# Patient Record
Sex: Male | Born: 1970 | Race: White | Hispanic: No | Marital: Married | Smoking: Never smoker
Health system: Southern US, Community
[De-identification: ages and names within clinical notes are randomized; demographics above are authoritative.]

## PROBLEM LIST (undated history)

## (undated) DIAGNOSIS — E876 Hypokalemia: Secondary | ICD-10-CM

## (undated) DIAGNOSIS — E785 Hyperlipidemia, unspecified: Secondary | ICD-10-CM

## (undated) DIAGNOSIS — E7849 Other hyperlipidemia: Secondary | ICD-10-CM

## (undated) DIAGNOSIS — R079 Chest pain, unspecified: Secondary | ICD-10-CM

## (undated) DIAGNOSIS — G4700 Insomnia, unspecified: Secondary | ICD-10-CM

## (undated) DIAGNOSIS — F1020 Alcohol dependence, uncomplicated: Secondary | ICD-10-CM

## (undated) DIAGNOSIS — K219 Gastro-esophageal reflux disease without esophagitis: Secondary | ICD-10-CM

## (undated) DIAGNOSIS — F10239 Alcohol dependence with withdrawal, unspecified: Secondary | ICD-10-CM

## (undated) DIAGNOSIS — I4891 Unspecified atrial fibrillation: Secondary | ICD-10-CM

## (undated) DIAGNOSIS — M87051 Idiopathic aseptic necrosis of right femur: Secondary | ICD-10-CM

## (undated) DIAGNOSIS — R351 Nocturia: Secondary | ICD-10-CM

## (undated) DIAGNOSIS — R112 Nausea with vomiting, unspecified: Secondary | ICD-10-CM

## (undated) DIAGNOSIS — D649 Anemia, unspecified: Secondary | ICD-10-CM

## (undated) DIAGNOSIS — F419 Anxiety disorder, unspecified: Secondary | ICD-10-CM

## (undated) DIAGNOSIS — F1010 Alcohol abuse, uncomplicated: Secondary | ICD-10-CM

## (undated) DIAGNOSIS — I499 Cardiac arrhythmia, unspecified: Secondary | ICD-10-CM

## (undated) DIAGNOSIS — M2550 Pain in unspecified joint: Secondary | ICD-10-CM

## (undated) DIAGNOSIS — E119 Type 2 diabetes mellitus without complications: Secondary | ICD-10-CM

## (undated) DIAGNOSIS — F329 Major depressive disorder, single episode, unspecified: Secondary | ICD-10-CM

## (undated) DIAGNOSIS — R7989 Other specified abnormal findings of blood chemistry: Secondary | ICD-10-CM

## (undated) DIAGNOSIS — M1990 Unspecified osteoarthritis, unspecified site: Secondary | ICD-10-CM

## (undated) DIAGNOSIS — F32A Depression, unspecified: Secondary | ICD-10-CM

## (undated) DIAGNOSIS — M2540 Effusion, unspecified joint: Secondary | ICD-10-CM

## (undated) DIAGNOSIS — M6208 Separation of muscle (nontraumatic), other site: Secondary | ICD-10-CM

## (undated) DIAGNOSIS — M87052 Idiopathic aseptic necrosis of left femur: Secondary | ICD-10-CM

## (undated) DIAGNOSIS — M47816 Spondylosis without myelopathy or radiculopathy, lumbar region: Secondary | ICD-10-CM

## (undated) DIAGNOSIS — R739 Hyperglycemia, unspecified: Secondary | ICD-10-CM

## (undated) DIAGNOSIS — R945 Abnormal results of liver function studies: Secondary | ICD-10-CM

## (undated) DIAGNOSIS — M109 Gout, unspecified: Secondary | ICD-10-CM

## (undated) DIAGNOSIS — I10 Essential (primary) hypertension: Secondary | ICD-10-CM

## (undated) DIAGNOSIS — G9340 Encephalopathy, unspecified: Secondary | ICD-10-CM

## (undated) HISTORY — DX: Hypokalemia: E87.6

## (undated) HISTORY — DX: Gastro-esophageal reflux disease without esophagitis: K21.9

## (undated) HISTORY — DX: Hyperglycemia, unspecified: R73.9

## (undated) HISTORY — PX: COLONOSCOPY: SHX174

## (undated) HISTORY — DX: Other hyperlipidemia: E78.49

## (undated) HISTORY — DX: Alcohol dependence with withdrawal, unspecified: F10.239

## (undated) HISTORY — DX: Chest pain, unspecified: R07.9

## (undated) HISTORY — DX: Nausea with vomiting, unspecified: R11.2

## (undated) HISTORY — DX: Idiopathic aseptic necrosis of left femur: M87.052

## (undated) HISTORY — DX: Encephalopathy, unspecified: G93.40

## (undated) HISTORY — DX: Spondylosis without myelopathy or radiculopathy, lumbar region: M47.816

## (undated) HISTORY — DX: Unspecified atrial fibrillation: I48.91

## (undated) HISTORY — DX: Anemia, unspecified: D64.9

## (undated) HISTORY — DX: Insomnia, unspecified: G47.00

## (undated) HISTORY — DX: Abnormal results of liver function studies: R94.5

## (undated) HISTORY — DX: Idiopathic aseptic necrosis of right femur: M87.051

## (undated) HISTORY — DX: Separation of muscle (nontraumatic), other site: M62.08

## (undated) HISTORY — DX: Other specified abnormal findings of blood chemistry: R79.89

## (undated) HISTORY — DX: Major depressive disorder, single episode, unspecified: F32.9

## (undated) HISTORY — DX: Alcohol dependence, uncomplicated: F10.20

## (undated) HISTORY — DX: Type 2 diabetes mellitus without complications: E11.9

## (undated) HISTORY — PX: ESOPHAGOGASTRODUODENOSCOPY: SHX1529

## (undated) HISTORY — DX: Anxiety disorder, unspecified: F41.9

---

## 2004-12-15 ENCOUNTER — Ambulatory Visit: Payer: Self-pay | Admitting: Family Medicine

## 2005-04-14 ENCOUNTER — Ambulatory Visit: Payer: Self-pay | Admitting: Family Medicine

## 2008-02-20 ENCOUNTER — Encounter: Admission: RE | Admit: 2008-02-20 | Discharge: 2008-02-20 | Payer: Self-pay | Admitting: Family Medicine

## 2008-02-24 ENCOUNTER — Encounter: Admission: RE | Admit: 2008-02-24 | Discharge: 2008-02-24 | Payer: Self-pay | Admitting: Family Medicine

## 2012-08-18 ENCOUNTER — Encounter (HOSPITAL_COMMUNITY): Payer: Self-pay | Admitting: Emergency Medicine

## 2012-08-18 ENCOUNTER — Inpatient Hospital Stay (HOSPITAL_COMMUNITY)
Admission: EM | Admit: 2012-08-18 | Discharge: 2012-08-25 | DRG: 750 | Disposition: A | Discharge status: Home / Self Care | Payer: BC Managed Care – PPO | Attending: Internal Medicine | Admitting: Internal Medicine

## 2012-08-18 DIAGNOSIS — E669 Obesity, unspecified: Secondary | ICD-10-CM | POA: Diagnosis present

## 2012-08-18 DIAGNOSIS — R7309 Other abnormal glucose: Secondary | ICD-10-CM

## 2012-08-18 DIAGNOSIS — I248 Other forms of acute ischemic heart disease: Secondary | ICD-10-CM | POA: Diagnosis present

## 2012-08-18 DIAGNOSIS — E871 Hypo-osmolality and hyponatremia: Secondary | ICD-10-CM | POA: Diagnosis present

## 2012-08-18 DIAGNOSIS — D61818 Other pancytopenia: Secondary | ICD-10-CM | POA: Diagnosis present

## 2012-08-18 DIAGNOSIS — M109 Gout, unspecified: Secondary | ICD-10-CM | POA: Diagnosis present

## 2012-08-18 DIAGNOSIS — F10239 Alcohol dependence with withdrawal, unspecified: Secondary | ICD-10-CM | POA: Diagnosis present

## 2012-08-18 DIAGNOSIS — R0609 Other forms of dyspnea: Secondary | ICD-10-CM | POA: Diagnosis present

## 2012-08-18 DIAGNOSIS — E876 Hypokalemia: Secondary | ICD-10-CM

## 2012-08-18 DIAGNOSIS — E785 Hyperlipidemia, unspecified: Secondary | ICD-10-CM | POA: Diagnosis present

## 2012-08-18 DIAGNOSIS — K219 Gastro-esophageal reflux disease without esophagitis: Secondary | ICD-10-CM | POA: Diagnosis present

## 2012-08-18 DIAGNOSIS — E869 Volume depletion, unspecified: Secondary | ICD-10-CM | POA: Diagnosis present

## 2012-08-18 DIAGNOSIS — F10231 Alcohol dependence with withdrawal delirium: Principal | ICD-10-CM | POA: Diagnosis present

## 2012-08-18 DIAGNOSIS — F1010 Alcohol abuse, uncomplicated: Secondary | ICD-10-CM | POA: Diagnosis present

## 2012-08-18 DIAGNOSIS — I10 Essential (primary) hypertension: Secondary | ICD-10-CM | POA: Diagnosis present

## 2012-08-18 DIAGNOSIS — R748 Abnormal levels of other serum enzymes: Secondary | ICD-10-CM | POA: Diagnosis present

## 2012-08-18 DIAGNOSIS — R112 Nausea with vomiting, unspecified: Secondary | ICD-10-CM

## 2012-08-18 DIAGNOSIS — F10931 Alcohol use, unspecified with withdrawal delirium: Principal | ICD-10-CM | POA: Diagnosis present

## 2012-08-18 DIAGNOSIS — R0989 Other specified symptoms and signs involving the circulatory and respiratory systems: Secondary | ICD-10-CM | POA: Diagnosis present

## 2012-08-18 DIAGNOSIS — I4891 Unspecified atrial fibrillation: Secondary | ICD-10-CM | POA: Diagnosis present

## 2012-08-18 DIAGNOSIS — Z79899 Other long term (current) drug therapy: Secondary | ICD-10-CM

## 2012-08-18 DIAGNOSIS — G9340 Encephalopathy, unspecified: Secondary | ICD-10-CM | POA: Diagnosis present

## 2012-08-18 DIAGNOSIS — R7989 Other specified abnormal findings of blood chemistry: Secondary | ICD-10-CM | POA: Diagnosis present

## 2012-08-18 DIAGNOSIS — K5289 Other specified noninfective gastroenteritis and colitis: Secondary | ICD-10-CM | POA: Diagnosis present

## 2012-08-18 DIAGNOSIS — F1020 Alcohol dependence, uncomplicated: Secondary | ICD-10-CM | POA: Diagnosis present

## 2012-08-18 DIAGNOSIS — F10939 Alcohol use, unspecified with withdrawal, unspecified: Secondary | ICD-10-CM

## 2012-08-18 DIAGNOSIS — Z8249 Family history of ischemic heart disease and other diseases of the circulatory system: Secondary | ICD-10-CM

## 2012-08-18 DIAGNOSIS — I2489 Other forms of acute ischemic heart disease: Secondary | ICD-10-CM | POA: Diagnosis present

## 2012-08-18 DIAGNOSIS — Z6831 Body mass index (BMI) 31.0-31.9, adult: Secondary | ICD-10-CM

## 2012-08-18 DIAGNOSIS — R739 Hyperglycemia, unspecified: Secondary | ICD-10-CM

## 2012-08-18 DIAGNOSIS — Z23 Encounter for immunization: Secondary | ICD-10-CM

## 2012-08-18 HISTORY — DX: Alcohol dependence with withdrawal, unspecified: F10.239

## 2012-08-18 HISTORY — DX: Hypokalemia: E87.6

## 2012-08-18 HISTORY — DX: Alcohol abuse, uncomplicated: F10.10

## 2012-08-18 HISTORY — DX: Gastro-esophageal reflux disease without esophagitis: K21.9

## 2012-08-18 HISTORY — DX: Nausea with vomiting, unspecified: R11.2

## 2012-08-18 HISTORY — DX: Hyperlipidemia, unspecified: E78.5

## 2012-08-18 HISTORY — DX: Hyperglycemia, unspecified: R73.9

## 2012-08-18 HISTORY — DX: Unspecified atrial fibrillation: I48.91

## 2012-08-18 HISTORY — DX: Alcohol use, unspecified with withdrawal, unspecified: F10.939

## 2012-08-18 HISTORY — DX: Essential (primary) hypertension: I10

## 2012-08-18 HISTORY — DX: Gout, unspecified: M10.9

## 2012-08-18 LAB — CBC WITH DIFFERENTIAL/PLATELET
Basophils Relative: 0 % (ref 0–1)
Eosinophils Relative: 0 % (ref 0–5)
Hemoglobin: 13.9 g/dL (ref 13.0–17.0)
Lymphs Abs: 0.5 10*3/uL — ABNORMAL LOW (ref 0.7–4.0)
MCH: 32.3 pg (ref 26.0–34.0)
MCHC: 34.4 g/dL (ref 30.0–36.0)
MCV: 93.7 fL (ref 78.0–100.0)
Monocytes Absolute: 1.1 10*3/uL — ABNORMAL HIGH (ref 0.1–1.0)
Neutro Abs: 5.2 10*3/uL (ref 1.7–7.7)
RBC: 4.31 MIL/uL (ref 4.22–5.81)

## 2012-08-18 LAB — COMPREHENSIVE METABOLIC PANEL
AST: 64 U/L — ABNORMAL HIGH (ref 0–37)
BUN: 14 mg/dL (ref 6–23)
CO2: 23 mEq/L (ref 19–32)
Chloride: 85 mEq/L — ABNORMAL LOW (ref 96–112)
Creatinine, Ser: 0.97 mg/dL (ref 0.50–1.35)
GFR calc Af Amer: >90 mL/min (ref >90)
GFR calc non Af Amer: >90 mL/min (ref >90)
Glucose, Bld: 226 mg/dL — ABNORMAL HIGH (ref 70–99)
Total Bilirubin: 0.8 mg/dL (ref 0.3–1.2)

## 2012-08-18 LAB — RAPID URINE DRUG SCREEN, HOSP PERFORMED
Opiates: NOT DETECTED
Tetrahydrocannabinol: NOT DETECTED

## 2012-08-18 LAB — GLUCOSE, CAPILLARY: Glucose-Capillary: 154 mg/dL — ABNORMAL HIGH (ref 70–99)

## 2012-08-18 MED ORDER — POTASSIUM CHLORIDE IN NACL 20-0.9 MEQ/L-% IV SOLN
INTRAVENOUS | Status: DC
  Administered 2012-08-18 – 2012-08-19 (×4): via INTRAVENOUS
  Administered 2012-08-20 – 2012-08-21 (×2): 50 mL/h via INTRAVENOUS
  Administered 2012-08-22 – 2012-08-23 (×3): via INTRAVENOUS
  Filled 2012-08-18 (×12): qty 1000

## 2012-08-18 MED ORDER — PNEUMOCOCCAL VAC POLYVALENT 25 MCG/0.5ML IJ INJ
0.5000 mL | INJECTION | INTRAMUSCULAR | Status: AC
  Administered 2012-08-20: 0.5 mL via INTRAMUSCULAR
  Filled 2012-08-18 (×2): qty 0.5

## 2012-08-18 MED ORDER — VITAMIN B-1 100 MG PO TABS
100.0000 mg | ORAL_TABLET | Freq: Every day | ORAL | Status: DC
  Administered 2012-08-21: 100 mg via ORAL
  Filled 2012-08-18 (×4): qty 1

## 2012-08-18 MED ORDER — LORAZEPAM 2 MG/ML IJ SOLN
0.0000 mg | Freq: Four times a day (QID) | INTRAMUSCULAR | Status: DC
  Administered 2012-08-18: 2 mg via INTRAVENOUS
  Filled 2012-08-18 (×2): qty 1

## 2012-08-18 MED ORDER — METOPROLOL TARTRATE 1 MG/ML IV SOLN
5.0000 mg | Freq: Once | INTRAVENOUS | Status: AC
  Administered 2012-08-18: 5 mg via INTRAVENOUS
  Filled 2012-08-18: qty 5

## 2012-08-18 MED ORDER — POTASSIUM CHLORIDE 10 MEQ/100ML IV SOLN
10.0000 meq | INTRAVENOUS | Status: AC
  Administered 2012-08-18 – 2012-08-19 (×6): 10 meq via INTRAVENOUS
  Filled 2012-08-18: qty 500
  Filled 2012-08-18: qty 100

## 2012-08-18 MED ORDER — SODIUM CHLORIDE 0.9 % IV SOLN: INTRAVENOUS | Status: DC

## 2012-08-18 MED ORDER — ADULT MULTIVITAMIN W/MINERALS CH
1.0000 | ORAL_TABLET | Freq: Every day | ORAL | Status: DC
  Filled 2012-08-18: qty 1

## 2012-08-18 MED ORDER — DILTIAZEM HCL 50 MG/10ML IV SOLN: 10.0000 mg | Freq: Once | INTRAVENOUS | Status: DC

## 2012-08-18 MED ORDER — FOLIC ACID 1 MG PO TABS
1.0000 mg | ORAL_TABLET | Freq: Every day | ORAL | Status: DC
  Filled 2012-08-18: qty 1

## 2012-08-18 MED ORDER — HYDROMORPHONE HCL PF 1 MG/ML IJ SOLN
0.5000 mg | INTRAMUSCULAR | Status: DC | PRN
  Administered 2012-08-22: 1 mg via INTRAVENOUS
  Filled 2012-08-18: qty 1

## 2012-08-18 MED ORDER — LORAZEPAM 2 MG/ML IJ SOLN
1.0000 mg | Freq: Once | INTRAMUSCULAR | Status: AC
  Administered 2012-08-18: 1 mg via INTRAVENOUS
  Filled 2012-08-18: qty 1

## 2012-08-18 MED ORDER — LORAZEPAM 2 MG/ML IJ SOLN: 2.0000 mg | Freq: Once | INTRAMUSCULAR | Status: DC

## 2012-08-18 MED ORDER — ONDANSETRON HCL 4 MG/2ML IJ SOLN
4.0000 mg | Freq: Once | INTRAMUSCULAR | Status: AC
  Administered 2012-08-18: 4 mg via INTRAVENOUS
  Filled 2012-08-18: qty 2

## 2012-08-18 MED ORDER — SODIUM CHLORIDE 0.9 % IV BOLUS (SEPSIS)
500.0000 mL | Freq: Once | INTRAVENOUS | Status: AC
  Administered 2012-08-18: 500 mL via INTRAVENOUS

## 2012-08-18 MED ORDER — POTASSIUM CHLORIDE CRYS ER 20 MEQ PO TBCR: 40.0000 meq | EXTENDED_RELEASE_TABLET | Freq: Once | ORAL | Status: DC

## 2012-08-18 MED ORDER — LORAZEPAM 1 MG PO TABS: 1.0000 mg | ORAL_TABLET | Freq: Four times a day (QID) | ORAL | Status: DC | PRN

## 2012-08-18 MED ORDER — ENOXAPARIN SODIUM 40 MG/0.4ML ~~LOC~~ SOLN
40.0000 mg | Freq: Every day | SUBCUTANEOUS | Status: DC
  Administered 2012-08-18 – 2012-08-24 (×7): 40 mg via SUBCUTANEOUS
  Filled 2012-08-18 (×8): qty 0.4

## 2012-08-18 MED ORDER — LORAZEPAM 2 MG/ML IJ SOLN
4.0000 mg | Freq: Once | INTRAMUSCULAR | Status: AC
  Administered 2012-08-19: 4 mg via INTRAVENOUS

## 2012-08-18 MED ORDER — ACETAMINOPHEN 325 MG PO TABS
650.0000 mg | ORAL_TABLET | Freq: Four times a day (QID) | ORAL | Status: DC | PRN
  Administered 2012-08-22: 650 mg via ORAL
  Filled 2012-08-18: qty 2

## 2012-08-18 MED ORDER — SODIUM CHLORIDE 0.9 % IV BOLUS (SEPSIS)
1000.0000 mL | Freq: Once | INTRAVENOUS | Status: AC
Start: 2012-08-18 — End: 2012-08-18
  Administered 2012-08-18: 1000 mL via INTRAVENOUS

## 2012-08-18 MED ORDER — ACETAMINOPHEN 650 MG RE SUPP: 650.0000 mg | Freq: Four times a day (QID) | RECTAL | Status: DC | PRN

## 2012-08-18 MED ORDER — ALUM & MAG HYDROXIDE-SIMETH 200-200-20 MG/5ML PO SUSP: 30.0000 mL | Freq: Four times a day (QID) | ORAL | Status: DC | PRN

## 2012-08-18 MED ORDER — LORAZEPAM 2 MG/ML IJ SOLN
1.0000 mg | Freq: Four times a day (QID) | INTRAMUSCULAR | Status: DC | PRN
  Filled 2012-08-18: qty 1

## 2012-08-18 MED ORDER — THIAMINE HCL 100 MG/ML IJ SOLN
100.0000 mg | Freq: Every day | INTRAMUSCULAR | Status: DC
  Filled 2012-08-18: qty 1

## 2012-08-18 MED ORDER — DILTIAZEM HCL 100 MG IV SOLR: 5.0000 mg/h | INTRAVENOUS | Status: DC

## 2012-08-18 MED ORDER — THIAMINE HCL 100 MG/ML IJ SOLN
100.0000 mg | Freq: Every day | INTRAMUSCULAR | Status: DC
  Administered 2012-08-19 – 2012-08-20 (×2): 100 mg via INTRAVENOUS
  Filled 2012-08-18 (×4): qty 1

## 2012-08-18 MED ORDER — LORAZEPAM 1 MG PO TABS: 1.0000 mg | ORAL_TABLET | ORAL | Status: DC | PRN

## 2012-08-18 MED ORDER — ONDANSETRON HCL 4 MG PO TABS: 4.0000 mg | ORAL_TABLET | Freq: Four times a day (QID) | ORAL | Status: DC | PRN

## 2012-08-18 MED ORDER — THIAMINE HCL 100 MG/ML IJ SOLN
Freq: Once | INTRAVENOUS | Status: AC
  Administered 2012-08-19: via INTRAVENOUS
  Filled 2012-08-18: qty 1000

## 2012-08-18 MED ORDER — LORAZEPAM 2 MG/ML IJ SOLN: 0.0000 mg | Freq: Two times a day (BID) | INTRAMUSCULAR | Status: DC

## 2012-08-18 MED ORDER — INSULIN ASPART 100 UNIT/ML ~~LOC~~ SOLN
0.0000 [IU] | SUBCUTANEOUS | Status: DC
Start: 2012-08-18 — End: 2012-08-20
  Administered 2012-08-18: 2 [IU] via SUBCUTANEOUS
  Administered 2012-08-19 (×3): 1 [IU] via SUBCUTANEOUS

## 2012-08-18 MED ORDER — LORAZEPAM 2 MG/ML IJ SOLN
2.0000 mg | Freq: Once | INTRAMUSCULAR | Status: AC
  Administered 2012-08-18: 2 mg via INTRAVENOUS

## 2012-08-18 MED ORDER — VITAMIN B-1 100 MG PO TABS
100.0000 mg | ORAL_TABLET | Freq: Every day | ORAL | Status: DC
  Filled 2012-08-18: qty 1

## 2012-08-18 MED ORDER — DILTIAZEM HCL 100 MG IV SOLR
5.0000 mg/h | INTRAVENOUS | Status: DC
  Administered 2012-08-18: 5 mg/h via INTRAVENOUS
  Administered 2012-08-18: 15 mg/h via INTRAVENOUS
  Administered 2012-08-19 – 2012-08-20 (×2): 5 mg/h via INTRAVENOUS

## 2012-08-18 MED ORDER — LORAZEPAM 2 MG/ML IJ SOLN
1.0000 mg | INTRAMUSCULAR | Status: DC | PRN
  Filled 2012-08-18: qty 2
  Filled 2012-08-18: qty 1

## 2012-08-18 MED ORDER — POTASSIUM CHLORIDE 10 MEQ/100ML IV SOLN: 10.0000 meq | INTRAVENOUS | Status: DC

## 2012-08-18 MED ORDER — SODIUM CHLORIDE 0.9 % IV BOLUS (SEPSIS)
1000.0000 mL | Freq: Once | INTRAVENOUS | Status: AC
  Administered 2012-08-18: 1000 mL via INTRAVENOUS

## 2012-08-18 MED ORDER — MAGNESIUM SULFATE 40 MG/ML IJ SOLN
4.0000 g | Freq: Once | INTRAMUSCULAR | Status: AC
  Administered 2012-08-18: 4 g via INTRAVENOUS
  Filled 2012-08-18: qty 100

## 2012-08-18 MED ORDER — ONDANSETRON HCL 4 MG/2ML IJ SOLN: 4.0000 mg | Freq: Four times a day (QID) | INTRAMUSCULAR | Status: DC | PRN

## 2012-08-18 MED ORDER — DILTIAZEM LOAD VIA INFUSION
10.0000 mg | Freq: Once | INTRAVENOUS | Status: AC
  Administered 2012-08-18: 10 mg via INTRAVENOUS
  Filled 2012-08-18: qty 10

## 2012-08-18 NOTE — H&P (Signed)
[facility] History and Physical  LAMONTA CYPRESS ZOX:[id number] DOB: Dec 16, 1970 DOA: 08/18/2012  Referring physician:  EDP PCP:  Dr. Thomasena Edis Specialists:   Chief Complaints:   Hallucinating, wants Detox from Alcohol, and Palpitations  HPI: Steven Cooke is a 42 y.o. male with a history of Alcohol abuse and heavy drinking over the past 3 months or more who presents with complaints of hallucinations and agitation X 4-5 days, after he had nausea and vomiting 5 days ago.  He was unable to hold down any food, liquids or alcohol.  Per family he got better 2 days ago after he drank 1 small airplane bottle of liquor.   His last drink was 1 day ago, and his family reports he normally drinks all day long.   He also has had complaints of palpitations and his heart rate was found to be in the 140s in the ED, and he was in Atrial fibrillation.   He was administered an IV cardizem bolus and placed on an IV Cardiaem drip and referred for admission.   He was also medicated with IV Ativan.      Review of Systems: Unable to Obtain from Patient.      Past Medical History  Diagnosis Date  . Alcohol abuse   . Hypertension   . Reflux   . Gout   . Hyperlipidemia   . GERD (gastroesophageal reflux disease)      Past Surgical History  Procedure Laterality Date  . No past surgeries       Medications:  HOME MEDS: Prior to Admission medications   Medication Sig Start Date End Date Taking? Authorizing Provider  bisoprolol (ZEBETA) 5 MG tablet Take 5 mg by mouth daily.   Yes Historical Provider, MD  citalopram (CELEXA) 20 MG tablet Take 20 mg by mouth daily.   Yes Historical Provider, MD  dexlansoprazole (DEXILANT) 60 MG capsule Take 60 mg by mouth daily.   Yes Historical Provider, MD  fenofibrate 160 MG tablet Take 160 mg by mouth daily.   Yes Historical Provider, MD  LORazepam (ATIVAN) 1 MG tablet Take 1 mg by mouth once. Given at [facility]   Yes Historical Provider, MD     Allergies:  No Known Allergies  Social History:   reports that he has never smoked. He has never used smokeless tobacco. He reports that  drinks alcohol. He reports that he does not use illicit drugs.  Family History: Family History  Problem Relation Age of Onset  . CAD Father        Physical Exam:  GEN:  Obtunded 41 year old Obese Caucasian Male examined  and in no acute distress;  Filed Vitals:   08/18/12 1930 08/18/12 1938 08/18/12 1945 08/18/12 2000  BP: 143/95 143/95 129/86 141/118  Pulse:  156 137 132  Temp:      TempSrc:      Resp: 18  20 20   SpO2:   96% 92%   Blood pressure 141/118, pulse 132, temperature 97.8 F (36.6 C), temperature source Oral, resp. rate 20, SpO2 92.00%. PSYCH: He is alert and oriented x1;  HEENT: Normocephalic and Atraumatic, + Facial Erythema, Bulbous Nose, Mucous membranes pink; PERRLA; EOM intact; Fundi:  Benign;  No scleral icterus, Nares: Patent, Oropharynx: Clear,  Fair Dentition, Neck:  FROM, no cervical lymphadenopathy nor thyromegaly or carotid bruit; no JVD; Breasts:: Not examined CHEST WALL: No tenderness CHEST: Normal respiration, clear to auscultation bilaterally HEART: Regular rate and rhythm; no murmurs rubs or  gallops BACK: No kyphosis or scoliosis; no CVA tenderness ABDOMEN: Positive Bowel Sounds, Obese, soft non-tender; no masses, no organomegaly. Rectal Exam: Not done EXTREMITIES: No cyanosis, clubbing or edema; no ulcerations. Genitalia: not examined PULSES: 2+ and symmetric SKIN: Normal hydration no rash or ulceration CNS: Cranial nerves 2-12 grossly intact no focal neurologic deficit     Labs & Imaging Results for orders placed during the hospital encounter of 08/18/12 (from the past 48 hour(s))  CBC WITH DIFFERENTIAL     Status: Abnormal   Collection Time    08/18/12  4:34 PM      Result Value Range   WBC 6.8  4.0 - 10.5 K/uL   RBC 4.31  4.22 - 5.81 MIL/uL   Hemoglobin 13.9  13.0 - 17.0 g/dL   HCT 16.1   09.6 - 04.5 %   MCV 93.7  78.0 - 100.0 fL   MCH 32.3  26.0 - 34.0 pg   MCHC 34.4  30.0 - 36.0 g/dL   RDW 40.9  81.1 - 91.4 %   Platelets 111 (*) 150 - 400 K/uL   Neutrophils Relative 76  43 - 77 %   Lymphocytes Relative 8 (*) 12 - 46 %   Monocytes Relative 16 (*) 3 - 12 %   Eosinophils Relative 0  0 - 5 %   Basophils Relative 0  0 - 1 %   Neutro Abs 5.2  1.7 - 7.7 K/uL   Lymphs Abs 0.5 (*) 0.7 - 4.0 K/uL   Monocytes Absolute 1.1 (*) 0.1 - 1.0 K/uL   Eosinophils Absolute 0.0  0.0 - 0.7 K/uL   Basophils Absolute 0.0  0.0 - 0.1 K/uL   WBC Morphology ATYPICAL LYMPHOCYTES     Smear Review PLATELET COUNT CONFIRMED BY SMEAR    COMPREHENSIVE METABOLIC PANEL     Status: Abnormal   Collection Time    08/18/12  4:34 PM      Result Value Range   Sodium 127 (*) 135 - 145 mEq/L   Potassium 2.7 (*) 3.5 - 5.1 mEq/L   Comment: CRITICAL RESULT CALLED TO, READ BACK BY AND VERIFIED WITH:     WATERS,Q. AT 1807 ON [id number] BY LOVE,T.   Chloride 85 (*) 96 - 112 mEq/L   CO2 23  19 - 32 mEq/L   Glucose, Bld 226 (*) 70 - 99 mg/dL   BUN 14  6 - 23 mg/dL   Creatinine, Ser 2.13  0.50 - 1.35 mg/dL   Calcium 08.6  8.4 - 57.8 mg/dL   Total Protein 7.7  6.0 - 8.3 g/dL   Albumin 3.6  3.5 - 5.2 g/dL   AST 64 (*) 0 - 37 U/L   ALT 84 (*) 0 - 53 U/L   Alkaline Phosphatase 28 (*) 39 - 117 U/L   Total Bilirubin 0.8  0.3 - 1.2 mg/dL   GFR calc non Af Amer >90  >90 mL/min   GFR calc Af Amer >90  >90 mL/min   Comment:            The eGFR has been calculated     using the CKD EPI equation.     This calculation has not been     validated in all clinical     situations.     eGFR's persistently     <90 mL/min signify     possible Chronic Kidney Disease.  ETHANOL     Status: None   Collection Time    08/18/12  4:34 PM      Result Value Range   Alcohol, Ethyl (B) <11  0 - 11 mg/dL   Comment:            LOWEST DETECTABLE LIMIT FOR     SERUM ALCOHOL IS 11 mg/dL     FOR MEDICAL PURPOSES ONLY  URINE RAPID DRUG  SCREEN (HOSP PERFORMED)     Status: None   Collection Time    08/18/12  6:06 PM      Result Value Range   Opiates NONE DETECTED  NONE DETECTED   Cocaine NONE DETECTED  NONE DETECTED   Benzodiazepines NONE DETECTED  NONE DETECTED   Amphetamines NONE DETECTED  NONE DETECTED   Tetrahydrocannabinol NONE DETECTED  NONE DETECTED   Barbiturates NONE DETECTED  NONE DETECTED   Comment:            DRUG SCREEN FOR MEDICAL PURPOSES     ONLY.  IF CONFIRMATION IS NEEDED     FOR ANY PURPOSE, NOTIFY LAB     WITHIN 5 DAYS.                LOWEST DETECTABLE LIMITS     FOR URINE DRUG SCREEN     Drug Class       Cutoff (ng/mL)     Amphetamine      1000     Barbiturate      200     Benzodiazepine   200     Tricyclics       300     Opiates          300     Cocaine          300     THC              50      EKG: ordered   Assessment/Plan Principal Problem:   Alcohol withdrawal Active Problems:   Atrial fibrillation with RVR   Hyperglycemia   Hypokalemia   Nausea and vomiting   Alcohol abuse   1.   Alcohol Withdawral and Delirium Tremens-    CIWA Protocol with IV Ativan.  Refer for Outpat Detox and Counselling Options when alert and stable.  2.   Atrial fibrillation with RVR-   IV Cardizem drip titrate to keep HR < 105.    May need AMiodarone  3.   Hyperglycemia-   SSI ordered, check glucose levels q 4 hours.   New Diabetes Mellitis, previously borderline.     4.   Hypokalemia-   Due to Nausea and Vomiting.  Replete K+, check magnesium.     5.   Nausea and Vomiting-   Antiemetics PRN.   6.   Alcohol Abuse-  See #1.     7.   DVT prophylaxis with Lovenox, Monitor Platelets.        Code Status:  FULL CODE   Family Communication:  Wife and Sister at Bedside Disposition Plan:  TBA,  Consider Outpatient vs Inpatient Alcohol Detox Treatment/Counselling  Time spent: 77 Minutes  Ron Parker [facility] Pager [phone]  If 7PM-7AM, please contact  night-coverage [email or web address] Password [facility] 08/18/2012, 8:37 PM

## 2012-08-18 NOTE — ED Notes (Signed)
tues has been withdraws from etoh liquior pints at day and has been shaking dranks ome airplane bottles today, some emesis the past few days shaking, not feeling well, went over to [facility] for detox and inpatient treatment and was given 1 mg ativan at [employer]

## 2012-08-18 NOTE — Progress Notes (Signed)
Subjective: Interval History: admitted earlier from ED with hallucinations, probable delirium tremens and atrial fibrillation. Patient somnolent, not arousable to verbal stimuli, becomes agitated with tactile stimuli, interventions. HR 130-150, atrial fibrillation.  Objective: Vital signs in last 24 hours: Temp:  [97.8 F (36.6 C)] 97.8 F (36.6 C) (03/21 1604) Pulse Rate:  [72-156] 132 (03/21 2000) Resp:  [18-28] 20 (03/21 2000) BP: (126-154)/(80-120) 141/118 mmHg (03/21 2000) SpO2:  [92 %-97 %] 92 % (03/21 2000)  Intake/Output from previous day:   Intake/Output this shift:    BP 141/118  Pulse 132  Temp(Src) 97.8 F (36.6 C) (Oral)  Resp 20  SpO2 92% General appearance: combative and becomes agitated with stimuli, otherwise minimally responsive. Lungs: clear to auscultation bilaterally Heart: irregular rhythm, tachyardic rate - atrial fibrillation on telemetry  Results for orders placed during the hospital encounter of 08/18/12 (from the past 24 hour(s))  CBC WITH DIFFERENTIAL     Status: Abnormal   Collection Time    08/18/12  4:34 PM      Result Value Range   WBC 6.8  4.0 - 10.5 K/uL   RBC 4.31  4.22 - 5.81 MIL/uL   Hemoglobin 13.9  13.0 - 17.0 g/dL   HCT 96.0  45.4 - 09.8 %   MCV 93.7  78.0 - 100.0 fL   MCH 32.3  26.0 - 34.0 pg   MCHC 34.4  30.0 - 36.0 g/dL   RDW 11.9  14.7 - 82.9 %   Platelets 111 (*) 150 - 400 K/uL   Neutrophils Relative 76  43 - 77 %   Lymphocytes Relative 8 (*) 12 - 46 %   Monocytes Relative 16 (*) 3 - 12 %   Eosinophils Relative 0  0 - 5 %   Basophils Relative 0  0 - 1 %   Neutro Abs 5.2  1.7 - 7.7 K/uL   Lymphs Abs 0.5 (*) 0.7 - 4.0 K/uL   Monocytes Absolute 1.1 (*) 0.1 - 1.0 K/uL   Eosinophils Absolute 0.0  0.0 - 0.7 K/uL   Basophils Absolute 0.0  0.0 - 0.1 K/uL   WBC Morphology ATYPICAL LYMPHOCYTES     Smear Review PLATELET COUNT CONFIRMED BY SMEAR    COMPREHENSIVE METABOLIC PANEL     Status: Abnormal   Collection Time    08/18/12   4:34 PM      Result Value Range   Sodium 127 (*) 135 - 145 mEq/L   Potassium 2.7 (*) 3.5 - 5.1 mEq/L   Chloride 85 (*) 96 - 112 mEq/L   CO2 23  19 - 32 mEq/L   Glucose, Bld 226 (*) 70 - 99 mg/dL   BUN 14  6 - 23 mg/dL   Creatinine, Ser 5.62  0.50 - 1.35 mg/dL   Calcium 13.0  8.4 - 86.5 mg/dL   Total Protein 7.7  6.0 - 8.3 g/dL   Albumin 3.6  3.5 - 5.2 g/dL   AST 64 (*) 0 - 37 U/L   ALT 84 (*) 0 - 53 U/L   Alkaline Phosphatase 28 (*) 39 - 117 U/L   Total Bilirubin 0.8  0.3 - 1.2 mg/dL   GFR calc non Af Amer >90  >90 mL/min   GFR calc Af Amer >90  >90 mL/min  ETHANOL     Status: None   Collection Time    08/18/12  4:34 PM      Result Value Range   Alcohol, Ethyl (B) <11  0 -  11 mg/dL  URINE RAPID DRUG SCREEN (HOSP PERFORMED)     Status: None   Collection Time    08/18/12  6:06 PM      Result Value Range   Opiates NONE DETECTED  NONE DETECTED   Cocaine NONE DETECTED  NONE DETECTED   Benzodiazepines NONE DETECTED  NONE DETECTED   Amphetamines NONE DETECTED  NONE DETECTED   Tetrahydrocannabinol NONE DETECTED  NONE DETECTED   Barbiturates NONE DETECTED  NONE DETECTED  TROPONIN I     Status: Abnormal   Collection Time    08/18/12  7:58 PM      Result Value Range   Troponin I 1.69 (*) <0.30 ng/mL  MAGNESIUM     Status: Abnormal   Collection Time    08/18/12  7:58 PM      Result Value Range   Magnesium 0.9 (*) 1.5 - 2.5 mg/dL  GLUCOSE, CAPILLARY     Status: Abnormal   Collection Time    08/18/12  9:55 PM      Result Value Range   Glucose-Capillary 154 (*) 70 - 99 mg/dL   Comment 1 Documented in Chart     Comment 2 Notify RN      Studies/Results: No results found.  Scheduled Meds: . enoxaparin (LOVENOX) injection  40 mg Subcutaneous Q2000  . insulin aspart  0-9 Units Subcutaneous Q4H  . LORazepam  0-4 mg Intravenous Q6H   Followed by  . [START ON 08/20/2012] LORazepam  0-4 mg Intravenous Q12H  . magnesium sulfate 1 - 4 g bolus IVPB  4 g Intravenous Once  .  metoprolol  5 mg Intravenous Once  . [START ON 08/19/2012] pneumococcal 23 valent vaccine  0.5 mL Intramuscular Tomorrow-1000  . potassium chloride  10 mEq Intravenous Q1 Hr x 6  . banana bag IV 1000 mL ** MVI currently unavailable **   Intravenous Once  . sodium chloride  500 mL Intravenous Once  . thiamine  100 mg Oral Daily   Or  . thiamine  100 mg Intravenous Daily   Continuous Infusions: . 0.9 % NaCl with KCl 20 mEq / L 125 mL/hr at 08/18/12 2156  . diltiazem (CARDIZEM) infusion 15 mg/hr (08/18/12 1940)   PRN Meds:acetaminophen, acetaminophen, HYDROmorphone (DILAUDID) injection, LORazepam, LORazepam, ondansetron (ZOFRAN) IV, ondansetron  Assessment/Plan:  EKG Changes: EKG at 21:42 pm with ST changes Discussed with Elink and called Cardiology (Dr. Clifton James) as recommended. Per Dr. Clifton James changes likely due to demand ischemia and electrolyte imbalance (potassium 2.7 and magnesium 0.9). Should improve with hydration and electrolyte replacement as well as rate control.  Atrial fibrillation: On Cardizem drip at 20 mg/hr. HR 130s with BP 140/70 - OTO Lopressor 5 mg IV.    LOS: 0 days   Ericka Marcellus A.

## 2012-08-18 NOTE — ED Provider Notes (Signed)
History     CSN: [id number]  Arrival date & time 08/18/12  1541   First MD Initiated Contact with Patient 08/18/12 1619      Chief Complaint  Patient presents with  . Delirium Tremens (DTS)    (Consider location/radiation/quality/duration/timing/severity/associated sxs/prior treatment) HPI  Past Medical History  Diagnosis Date  . Alcohol abuse   . Hypertension   . Reflux   . Gout     History reviewed. No pertinent past surgical history.  No family history on file.  History  Substance Use Topics  . Smoking status: Not on file  . Smokeless tobacco: Not on file  . Alcohol Use: Not on file      Review of Systems  Allergies  Review of patient's allergies indicates no known allergies.  Home Medications   Current Outpatient Rx  Name  Route  Sig  Dispense  Refill  . bisoprolol (ZEBETA) 5 MG tablet   Oral   Take 5 mg by mouth daily.         . citalopram (CELEXA) 20 MG tablet   Oral   Take 20 mg by mouth daily.         Marland Kitchen dexlansoprazole (DEXILANT) 60 MG capsule   Oral   Take 60 mg by mouth daily.         . fenofibrate 160 MG tablet   Oral   Take 160 mg by mouth daily.         Marland Kitchen LORazepam (ATIVAN) 1 MG tablet   Oral   Take 1 mg by mouth once. Given at [facility]           BP 146/110  Pulse 152  Temp(Src) 97.8 F (36.6 C) (Oral)  Resp 22  SpO2 96%  Physical Exam  ED Course  Procedures (including critical care time)  Labs Reviewed  CBC WITH DIFFERENTIAL  COMPREHENSIVE METABOLIC PANEL  ETHANOL  URINE RAPID DRUG SCREEN (HOSP PERFORMED)   No results found.   No diagnosis found.  . Date: 08/18/2012  Rate: 168  Rhythm: atrial fibrillation  QRS Axis: normal  Intervals: normal  ST/T Wave abnormalities: normal  Conduction Disutrbances:none  Narrative Interpretation:   Old EKG Reviewed: changes noted   MDM  See my note signed 08/18/12 at 19:53    Donnetta Hutching M.D.        Donnetta Hutching, MD 08/26/12 [phone]

## 2012-08-18 NOTE — ED Notes (Signed)
Pt notified that urine is needed 

## 2012-08-18 NOTE — ED Provider Notes (Signed)
History     CSN: [id number]  Arrival date & time 08/18/12  1541   First MD Initiated Contact with Patient 08/18/12 1619      Chief Complaint  Patient presents with  . Delirium Tremens (DTS)    (Consider location/radiation/quality/duration/timing/severity/associated sxs/prior treatment) HPI... level V caveat for urgent need for intervention. Patient has had long-term drinking problem. Today his pulse became rapid and erratic. According to his wife, he has had bouts of atrial fibrillation the past she is uncertain where he has had followup.  Patient has history of hypertension and hypercholesterolemia. No cigarette smoking. Wife reports hallucinations yesterday from alcohol  Past Medical History  Diagnosis Date  . Alcohol abuse   . Hypertension   . Reflux   . Gout     History reviewed. No pertinent past surgical history.  No family history on file.  History  Substance Use Topics  . Smoking status: Not on file  . Smokeless tobacco: Not on file  . Alcohol Use: Not on file      Review of Systems  Unable to perform ROS: Unstable vital signs    Allergies  Review of patient's allergies indicates no known allergies.  Home Medications   Current Outpatient Rx  Name  Route  Sig  Dispense  Refill  . bisoprolol (ZEBETA) 5 MG tablet   Oral   Take 5 mg by mouth daily.         . citalopram (CELEXA) 20 MG tablet   Oral   Take 20 mg by mouth daily.         Marland Kitchen dexlansoprazole (DEXILANT) 60 MG capsule   Oral   Take 60 mg by mouth daily.         . fenofibrate 160 MG tablet   Oral   Take 160 mg by mouth daily.         Marland Kitchen LORazepam (ATIVAN) 1 MG tablet   Oral   Take 1 mg by mouth once. Given at [facility]           BP 143/95  Pulse 156  Temp(Src) 97.8 F (36.6 C) (Oral)  Resp 25  SpO2 95%  Physical Exam  Nursing note and vitals reviewed. Constitutional: He is oriented to person, place, and time.  Obese, tachycardic, red faced  HENT:  Head:  Normocephalic and atraumatic.  Eyes: Conjunctivae and EOM are normal. Pupils are equal, round, and reactive to light.  Neck: Normal range of motion. Neck supple.  Cardiovascular: Normal heart sounds.   Tachycardic, irregularly irregular  Pulmonary/Chest: Effort normal and breath sounds normal.  Abdominal: Soft. Bowel sounds are normal.  Musculoskeletal: Normal range of motion.  Neurological: He is alert and oriented to person, place, and time.  Skin: Skin is warm and dry.  Psychiatric: He has a normal mood and affect.    ED Course  Procedures (including critical care time)  Labs Reviewed  CBC WITH DIFFERENTIAL - Abnormal; Notable for the following:    Platelets 111 (*)    Lymphocytes Relative 8 (*)    Monocytes Relative 16 (*)    Lymphs Abs 0.5 (*)    Monocytes Absolute 1.1 (*)    All other components within normal limits  COMPREHENSIVE METABOLIC PANEL - Abnormal; Notable for the following:    Sodium 127 (*)    Potassium 2.7 (*)    Chloride 85 (*)    Glucose, Bld 226 (*)    AST 64 (*)    ALT 84 (*)  Alkaline Phosphatase 28 (*)    All other components within normal limits  ETHANOL  URINE RAPID DRUG SCREEN (HOSP PERFORMED)  TROPONIN I   No results found.   1. Atrial fibrillation   2. Alcohol withdrawal    CRITICAL CARE Performed by: Donnetta Hutching   Total critical care time: 30  Critical care time was exclusive of separately billable procedures and treating other patients.  Critical care was necessary to treat or prevent imminent or life-threatening deterioration.  Critical care was time spent personally by me on the following activities: development of treatment plan with patient and/or surrogate as well as nursing, discussions with consultants, evaluation of patient's response to treatment, examination of patient, obtaining history from patient or surrogate, ordering and performing treatments and interventions, ordering and review of laboratory studies, ordering and  review of radiographic studies, pulse oximetry and re-evaluation of patient's condition.   MDM  Patient noted to be in atrial fibrillation.  IV Cardizem bolus and drip started. Also IV beta blocker.  Vigorous IV hydration. IV Ativan for agitation.  Potassium low. We'll replace orally. Discussed with Dr. Lovell Sheehan. Admit.        Donnetta Hutching, MD 08/18/12 [phone]

## 2012-08-19 DIAGNOSIS — G9340 Encephalopathy, unspecified: Secondary | ICD-10-CM

## 2012-08-19 DIAGNOSIS — F1010 Alcohol abuse, uncomplicated: Secondary | ICD-10-CM

## 2012-08-19 DIAGNOSIS — F10239 Alcohol dependence with withdrawal, unspecified: Secondary | ICD-10-CM

## 2012-08-19 DIAGNOSIS — F10939 Alcohol use, unspecified with withdrawal, unspecified: Secondary | ICD-10-CM

## 2012-08-19 DIAGNOSIS — I4891 Unspecified atrial fibrillation: Secondary | ICD-10-CM

## 2012-08-19 DIAGNOSIS — E876 Hypokalemia: Secondary | ICD-10-CM

## 2012-08-19 HISTORY — DX: Encephalopathy, unspecified: G93.40

## 2012-08-19 LAB — GLUCOSE, CAPILLARY
Glucose-Capillary: 117 mg/dL — ABNORMAL HIGH (ref 70–99)
Glucose-Capillary: 123 mg/dL — ABNORMAL HIGH (ref 70–99)
Glucose-Capillary: 133 mg/dL — ABNORMAL HIGH (ref 70–99)

## 2012-08-19 LAB — URINE MICROSCOPIC-ADD ON

## 2012-08-19 LAB — URINALYSIS, ROUTINE W REFLEX MICROSCOPIC
Glucose, UA: NEGATIVE mg/dL
Ketones, ur: NEGATIVE mg/dL
pH: 5.5 (ref 5.0–8.0)

## 2012-08-19 LAB — BASIC METABOLIC PANEL
BUN: 11 mg/dL (ref 6–23)
BUN: 11 mg/dL (ref 6–23)
CO2: 25 mEq/L (ref 19–32)
Calcium: 8.7 mg/dL (ref 8.4–10.5)
Chloride: 101 mEq/L (ref 96–112)
Creatinine, Ser: 0.97 mg/dL (ref 0.50–1.35)
GFR calc non Af Amer: >90 mL/min (ref >90)
Glucose, Bld: 127 mg/dL — ABNORMAL HIGH (ref 70–99)
Glucose, Bld: 113 mg/dL — ABNORMAL HIGH (ref 70–99)
Sodium: 129 mEq/L — ABNORMAL LOW (ref 135–145)

## 2012-08-19 LAB — MRSA PCR SCREENING: MRSA by PCR: NEGATIVE

## 2012-08-19 LAB — CBC
HCT: 34.2 % — ABNORMAL LOW (ref 39.0–52.0)
MCHC: 33.3 g/dL (ref 30.0–36.0)
MCV: 98 fL (ref 78.0–100.0)
RDW: 12.6 % (ref 11.5–15.5)

## 2012-08-19 MED ORDER — DEXMEDETOMIDINE HCL IN NACL 400 MCG/100ML IV SOLN
0.2000 ug/kg/h | INTRAVENOUS | Status: DC
  Administered 2012-08-19 – 2012-08-20 (×5): 0.7 ug/kg/h via INTRAVENOUS
  Administered 2012-08-20: 0.3 ug/kg/h via INTRAVENOUS
  Filled 2012-08-19 (×6): qty 100

## 2012-08-19 MED ORDER — PANTOPRAZOLE SODIUM 40 MG IV SOLR
40.0000 mg | Freq: Every day | INTRAVENOUS | Status: DC
  Administered 2012-08-19 (×2): 40 mg via INTRAVENOUS
  Filled 2012-08-19 (×3): qty 40

## 2012-08-19 MED ORDER — LORAZEPAM 2 MG/ML IJ SOLN
2.0000 mg | INTRAMUSCULAR | Status: DC | PRN
  Administered 2012-08-19 – 2012-08-22 (×13): 2 mg via INTRAVENOUS
  Filled 2012-08-19 (×7): qty 1
  Filled 2012-08-19: qty 2
  Filled 2012-08-19 (×6): qty 1
  Filled 2012-08-19: qty 2
  Filled 2012-08-19 (×2): qty 1

## 2012-08-19 MED ORDER — POTASSIUM CHLORIDE CRYS ER 20 MEQ PO TBCR: 40.0000 meq | EXTENDED_RELEASE_TABLET | Freq: Once | ORAL | Status: DC

## 2012-08-19 MED ORDER — LORAZEPAM 2 MG/ML IJ SOLN
4.0000 mg | Freq: Once | INTRAMUSCULAR | Status: AC
  Administered 2012-08-19: 4 mg via INTRAVENOUS

## 2012-08-19 MED ORDER — FENOFIBRATE 160 MG PO TABS
160.0000 mg | ORAL_TABLET | Freq: Every day | ORAL | Status: DC
  Filled 2012-08-19: qty 1

## 2012-08-19 MED ORDER — POTASSIUM CHLORIDE 10 MEQ/100ML IV SOLN
10.0000 meq | INTRAVENOUS | Status: AC
  Administered 2012-08-19 (×3): 10 meq via INTRAVENOUS
  Filled 2012-08-19: qty 300

## 2012-08-19 MED ORDER — FOLIC ACID 5 MG/ML IJ SOLN
1.0000 mg | Freq: Every day | INTRAMUSCULAR | Status: DC
  Administered 2012-08-19 – 2012-08-23 (×5): 1 mg via INTRAVENOUS
  Filled 2012-08-19 (×7): qty 0.2

## 2012-08-19 MED ORDER — LORAZEPAM 2 MG/ML IJ SOLN
4.0000 mg | Freq: Once | INTRAMUSCULAR | Status: AC
  Administered 2012-08-20: 4 mg via INTRAVENOUS

## 2012-08-19 MED ORDER — DEXMEDETOMIDINE HCL IN NACL 200 MCG/50ML IV SOLN
0.2000 ug/kg/h | INTRAVENOUS | Status: DC
  Administered 2012-08-19: 0.5 ug/kg/h via INTRAVENOUS
  Administered 2012-08-19: 0.7 ug/kg/h via INTRAVENOUS
  Administered 2012-08-19: 0.2 ug/kg/h via INTRAVENOUS
  Filled 2012-08-19 (×3): qty 50

## 2012-08-19 MED ORDER — POTASSIUM CHLORIDE 10 MEQ/100ML IV SOLN: 10.0000 meq | INTRAVENOUS | Status: DC

## 2012-08-19 MED ORDER — POTASSIUM CHLORIDE 10 MEQ/100ML IV SOLN
10.0000 meq | INTRAVENOUS | Status: AC
  Administered 2012-08-19 (×3): 10 meq via INTRAVENOUS
  Filled 2012-08-19 (×3): qty 100

## 2012-08-19 MED ORDER — CITALOPRAM HYDROBROMIDE 20 MG PO TABS
20.0000 mg | ORAL_TABLET | Freq: Every day | ORAL | Status: DC
  Filled 2012-08-19: qty 1

## 2012-08-19 MED ORDER — METOPROLOL TARTRATE 1 MG/ML IV SOLN: 5.0000 mg | Freq: Four times a day (QID) | INTRAVENOUS | Status: DC

## 2012-08-19 MED ORDER — LORAZEPAM 2 MG/ML IJ SOLN
2.0000 mg | Freq: Once | INTRAMUSCULAR | Status: AC
  Administered 2012-08-19: 2 mg via INTRAVENOUS

## 2012-08-19 MED ORDER — BISOPROLOL FUMARATE 5 MG PO TABS
5.0000 mg | ORAL_TABLET | Freq: Every day | ORAL | Status: DC
  Filled 2012-08-19: qty 1

## 2012-08-19 NOTE — Progress Notes (Signed)
Pt has been agitated most of the night (fighting, pulling lines, and trying to get out of bed).  [facility] in to visit and pt abnormal labs treated.  E-link, MD also aware of pt status, new orders received and initiated throughout the night. Conley Rolls, RN

## 2012-08-19 NOTE — Progress Notes (Signed)
CRITICAL VALUE ALERT  Critical value received:  Troponin 1.69  Date of notification:  08/18/12  Time of notification:  2045  Critical value read back: yes  Nurse who received alert:  Conley Rolls, RN  MD notified (1st page):  2134  Time of first page:  2134  MD notified (2nd page):  Time of second page:  Responding MD:  [facility], NP  Time MD responded:  2134

## 2012-08-19 NOTE — Progress Notes (Signed)
Agitation on Precedex  Ativan ordered for prn use.

## 2012-08-19 NOTE — Progress Notes (Signed)
CRITICAL VALUE ALERT  Critical value received:  MAG 0.9  Date of notification:  08/18/2012  Time of notification:  2134  Critical value read back: yes  Nurse who received alert:  Conley Rolls, RN  MD notified (1st page):  [facility], NP  Time of first page:  2134  MD notified (2nd page):  Time of second page:  Responding MD:  [facility], NP  Time MD responded:  2134

## 2012-08-19 NOTE — Consult Note (Addendum)
PULMONARY  / CRITICAL CARE MEDICINE  Name: Steven Cooke MRN: [id number] DOB: 12-18-1970    ADMISSION DATE:  08/18/2012 CONSULTATION DATE:  08/19/2012   REFERRING MD :  TRH  CHIEF COMPLAINT:  Acute alcohol withdrawal  BRIEF PATIENT DESCRIPTION: 42 year old white male with history of alcohol abuse and heavy drinking over the past 3 months presents now with increasing hallucinations and agitation x 5 days. He had increased nausea vomiting 5 days ago. Patient then drank a large amount of liquor 2 days prior to admission. And then one day prior to admission was still drinking.  The pt complained  of palpitations and found to have atrial fibrillation with rapid ventricular response. Now on IV Cardizem bolus IV Cardizem drip and also given IV Ativan. The patient dud  not respond to this treatment and had to be placed on Precedex over night of 3/21. Critical care is now consult.  SIGNIFICANT EVENTS / STUDIES:  none  LINES / TUBES: PIV  CULTURES: none  ANTIBIOTICS: none  HISTORY OF PRESENT ILLNESS:  42 year old white male with heavy alcohol abuse now comes to the emergency room seeking detox and proceeds to develop full-blown alcohol withdrawal. The patient's place to the ICU. There is atrial fibrillation with rapid ventricular response and on this the patient's receiving Cardizem. Critical care was consulted when the patient part of Precedex drip  PAST MEDICAL HISTORY :  Past Medical History  Diagnosis Date  . Alcohol abuse   . Hypertension   . Reflux   . Gout   . Hyperlipidemia   . GERD (gastroesophageal reflux disease)    Past Surgical History  Procedure Laterality Date  . No past surgeries     Prior to Admission medications   Medication Sig Start Date End Date Taking? Authorizing Provider  bisoprolol (ZEBETA) 5 MG tablet Take 5 mg by mouth daily.   Yes Historical Provider, MD  citalopram (CELEXA) 20 MG tablet Take 20 mg by mouth daily.   Yes Historical Provider, MD   dexlansoprazole (DEXILANT) 60 MG capsule Take 60 mg by mouth daily.   Yes Historical Provider, MD  fenofibrate 160 MG tablet Take 160 mg by mouth daily.   Yes Historical Provider, MD  LORazepam (ATIVAN) 1 MG tablet Take 1 mg by mouth once. Given at [facility]   Yes Historical Provider, MD   No Known Allergies  FAMILY HISTORY:  Family History  Problem Relation Age of Onset  . CAD Father    SOCIAL HISTORY:  reports that he has never smoked. He has never used smokeless tobacco. He reports that  drinks alcohol. He reports that he does not use illicit drugs.  REVIEW OF SYSTEMS:  Unable to obtain due to severe encephalopathy  SUBJECTIVE:  The patient currently is resting quietly in restraints in bed on a Precedex drip and is now converted to normal sinus rhythm on Cardizem drip  VITAL SIGNS: Temp:  [97.4 F (36.3 C)-99.6 F (37.6 C)] 99.6 F (37.6 C) (03/22 0800) Pulse Rate:  [58-156] 89 (03/22 0600) Resp:  [18-28] 23 (03/22 0600) BP: (83-218)/(60-191) 102/60 mmHg (03/22 0600) SpO2:  [92 %-100 %] 99 % (03/22 0600) Weight:  [94 kg (207 lb 3.7 oz)] 94 kg (207 lb 3.7 oz) (03/21 2030) HEMODYNAMICS: Hemodynamically stable and on Cardizem drip   VENTILATOR SETTINGS: Nasal cannula oxygen   INTAKE / OUTPUT: Intake/Output     03/21 0701 - 03/22 0700 03/22 0701 - 03/23 0700   P.O. 400    I.V. (mL/kg)  1180.1 (12.6)    Other 1100    IV Piggyback 700    Total Intake(mL/kg) 3380.1 (36)    Urine (mL/kg/hr) 600    Total Output 600     Net +2780.1            PHYSICAL EXAMINATION: General:  Mildly obese heavyset white male in no acute distress Neuro:  Somnolent on Precedex drip had been agitated earlier HEENT:  No jugular venous distention no lymphadenopathy no thyromegaly dry mucous membranes short neck Cardiovascular:  Regular rate and rhythm without S3 normal S1-S2 no murmur rub heave or gallop Lungs:  Clear Abdomen:  Soft nontender bowel sounds hypoactive Musculoskeletal:   Full range of motion no joint deformity Skin:  Clear  LABS:  Recent Labs Lab 08/18/12 1634 08/18/12 1958 08/19/12 0249 08/19/12 0817  HGB 13.9  --   --  11.4*  WBC 6.8  --   --  3.7*  PLT 111*  --   --  94*  NA 127*  --  129* 134*  K 2.7*  --  2.9* 3.8  CL 85*  --  94* 101  CO2 23  --  24 25  GLUCOSE 226*  --  127* 113*  BUN 14  --  11 11  CREATININE 0.97  --  0.83 0.97  CALCIUM 10.1  --  8.7 8.1*  MG  --  0.9* 2.3  --   AST 64*  --   --   --   ALT 84*  --   --   --   ALKPHOS 28*  --   --   --   BILITOT 0.8  --   --   --   PROT 7.7  --   --   --   ALBUMIN 3.6  --   --   --   TROPONINI  --  1.69* 1.09* 0.92*    Recent Labs Lab 08/18/12 2155 08/19/12 0020 08/19/12 0422 08/19/12 0854  GLUCAP 154* 133* 121* 117*    CXR: 08/18/2012: No acute disease  ASSESSMENT / PLAN: Principal Problem:   Alcohol withdrawal Active Problems:   Atrial fibrillation with RVR   Hyperglycemia   Hypokalemia   Nausea and vomiting   Alcohol abuse   Encephalopathy acute   PULMONARY A: Mild hypoxemia do to mild hypoventilation P:   Titrate oxygen  CARDIOVASCULAR A: Atrial fibrillation with rapid ventricular response now converted to normal sinus rhythm Hypertension P:  Continue Cardizem Obtain echocardiogram Continue beta blocker RENAL A:  Mild hyponatremia do to alcoholism P:   Monitor electrolyte Continue saline IV fluids  GASTROINTESTINAL A:  History of emesis and mild volume depletion P:   IV proton pump inhibitor Antiemetics  HEMATOLOGIC A:  No acute hematologic issues P:  Monitor  INFECTIOUS A:  No acute infectious disease issue P:   Monitor  ENDOCRINE A:  No endocrine issue   P:   Monitor  NEUROLOGIC A:   acute alcohol withdrawal with associated encephalopathy P:   Precedex drip  TODAY'S SUMMARY: 42 year old white male with acute alcohol withdrawal. Plan we maintain Precedex drip and use as needed Ativan. We'll administer IV thiamine and  folic acid. We'll continue beta blockade. We'll continue Cardizem drip for rate or fibrillation. We'll need to obtain echocardiogram. Continue IV fluids and monitor electrolytes. When the patient recovers alcohol counseling will be needed  I have personally obtained a history, examined the patient, evaluated laboratory and imaging results, formulated the assessment and plan and placed  orders. CRITICAL CARE: The patient is critically ill with multiple organ systems failure and requires high complexity decision making for assessment and support, frequent evaluation and titration of therapies, application of advanced monitoring technologies and extensive interpretation of multiple databases. Critical Care Time devoted to patient care services described in this note is 40 minutes.   Dorcas Carrow Pulmonary and Critical Care Medicine [facility]  [phone]  Cell  [phone]  If no response or cell goes to voicemail, call beeper [phone]   08/19/2012, 11:52 AM

## 2012-08-19 NOTE — Progress Notes (Signed)
Remains agitated   Ativan ordered

## 2012-08-19 NOTE — Progress Notes (Signed)
H/o alcohol abuse and heavy drinking; most likely in DT   Requiring frequent Ativan and still delirious trying to climb out of bed;   Precedex ordered.

## 2012-08-20 DIAGNOSIS — R778 Other specified abnormalities of plasma proteins: Secondary | ICD-10-CM

## 2012-08-20 HISTORY — DX: Other specified abnormalities of plasma proteins: R77.8

## 2012-08-20 LAB — HEPATIC FUNCTION PANEL
ALT: 44 U/L (ref 0–53)
AST: 34 U/L (ref 0–37)
Total Protein: 5.5 g/dL — ABNORMAL LOW (ref 6.0–8.3)

## 2012-08-20 LAB — GLUCOSE, CAPILLARY
Glucose-Capillary: 99 mg/dL (ref 70–99)
Glucose-Capillary: 93 mg/dL (ref 70–99)
Glucose-Capillary: 120 mg/dL — ABNORMAL HIGH (ref 70–99)
Glucose-Capillary: 115 mg/dL — ABNORMAL HIGH (ref 70–99)

## 2012-08-20 LAB — CBC WITH DIFFERENTIAL/PLATELET
Basophils Relative: 1 % (ref 0–1)
HCT: 30.8 % — ABNORMAL LOW (ref 39.0–52.0)
Hemoglobin: 10.4 g/dL — ABNORMAL LOW (ref 13.0–17.0)
Lymphs Abs: 0.6 10*3/uL — ABNORMAL LOW (ref 0.7–4.0)
MCH: 32.9 pg (ref 26.0–34.0)
MCHC: 33.8 g/dL (ref 30.0–36.0)
Monocytes Absolute: 0.5 10*3/uL (ref 0.1–1.0)
Monocytes Relative: 18 % — ABNORMAL HIGH (ref 3–12)
Neutro Abs: 1.8 10*3/uL (ref 1.7–7.7)
RBC: 3.16 MIL/uL — ABNORMAL LOW (ref 4.22–5.81)

## 2012-08-20 LAB — BASIC METABOLIC PANEL
BUN: 10 mg/dL (ref 6–23)
Chloride: 102 mEq/L (ref 96–112)
GFR calc Af Amer: >90 mL/min (ref >90)
Glucose, Bld: 105 mg/dL — ABNORMAL HIGH (ref 70–99)
Potassium: 3.5 mEq/L (ref 3.5–5.1)
Sodium: 134 mEq/L — ABNORMAL LOW (ref 135–145)

## 2012-08-20 MED ORDER — VITAMINS A & D EX OINT
TOPICAL_OINTMENT | CUTANEOUS | Status: AC
  Administered 2012-08-20: 5
  Filled 2012-08-20: qty 5

## 2012-08-20 MED ORDER — HALOPERIDOL LACTATE 5 MG/ML IJ SOLN
5.0000 mg | Freq: Once | INTRAMUSCULAR | Status: AC
  Administered 2012-08-20: 5 mg via INTRAVENOUS
  Filled 2012-08-20: qty 1

## 2012-08-20 MED ORDER — LORAZEPAM 2 MG/ML IJ SOLN
4.0000 mg | Freq: Once | INTRAMUSCULAR | Status: AC
  Administered 2012-08-20: 4 mg via INTRAVENOUS

## 2012-08-20 MED ORDER — DILTIAZEM HCL 60 MG PO TABS
60.0000 mg | ORAL_TABLET | Freq: Four times a day (QID) | ORAL | Status: DC
  Administered 2012-08-20 – 2012-08-22 (×8): 60 mg via ORAL
  Filled 2012-08-20 (×12): qty 1

## 2012-08-20 MED ORDER — PANTOPRAZOLE SODIUM 40 MG PO TBEC
40.0000 mg | DELAYED_RELEASE_TABLET | Freq: Every day | ORAL | Status: DC
  Administered 2012-08-20 – 2012-08-21 (×2): 40 mg via ORAL
  Filled 2012-08-20 (×4): qty 1

## 2012-08-20 MED ORDER — LORAZEPAM 1 MG PO TABS
1.0000 mg | ORAL_TABLET | Freq: Four times a day (QID) | ORAL | Status: DC
  Administered 2012-08-20 – 2012-08-22 (×8): 1 mg via ORAL
  Filled 2012-08-20 (×8): qty 1

## 2012-08-20 MED ORDER — INSULIN ASPART 100 UNIT/ML ~~LOC~~ SOLN
0.0000 [IU] | Freq: Three times a day (TID) | SUBCUTANEOUS | Status: DC
  Administered 2012-08-21 – 2012-08-22 (×6): 1 [IU] via SUBCUTANEOUS

## 2012-08-20 MED ORDER — BISOPROLOL FUMARATE 5 MG PO TABS
5.0000 mg | ORAL_TABLET | Freq: Every day | ORAL | Status: DC
  Administered 2012-08-20 – 2012-08-21 (×2): 5 mg via ORAL
  Filled 2012-08-20 (×3): qty 1

## 2012-08-20 NOTE — Progress Notes (Signed)
Agitation, the patient still delirious, trying to climb out of bed  Restrains ordered

## 2012-08-20 NOTE — Progress Notes (Addendum)
PULMONARY  / CRITICAL CARE MEDICINE  Name: Steven Cooke MRN: [id number] DOB: Sep 22, 1970    ADMISSION DATE:  08/18/2012 CONSULTATION DATE:  08/20/2012   REFERRING MD :  TRH  CHIEF COMPLAINT:  Acute alcohol withdrawal  BRIEF PATIENT DESCRIPTION: 42 year old white male with history of alcohol abuse and heavy drinking over the past 3 months presents now with increasing hallucinations and agitation x 5 days. He had increased nausea vomiting 5 days ago. Patient then drank a large amount of liquor 2 days prior to admission. And then one day prior to admission was still drinking.  The pt complained  of palpitations and found to have atrial fibrillation with rapid ventricular response. Now on IV Cardizem bolus IV Cardizem drip and also given IV Ativan. The patient dud  not respond to this treatment and had to be placed on Precedex over night of 3/21. Critical care is now consult.  SIGNIFICANT EVENTS / STUDIES:  none  LINES / TUBES: PIV  CULTURES: none  ANTIBIOTICS: none  SUBJECTIVE:  Pt was agitated over night. Trying to redirect the pt he calms down. On 0.65mcg/kg/min precedex  VITAL SIGNS: Temp:  [97.8 F (36.6 C)-99.4 F (37.4 C)] 98.6 F (37 C) (03/23 0800) Pulse Rate:  [77-110] 86 (03/23 0830) Resp:  [18-30] 22 (03/23 0900) BP: (95-158)/(58-97) 158/96 mmHg (03/23 0900) SpO2:  [96 %-100 %] 98 % (03/23 0900) Weight:  [98.4 kg (216 lb 14.9 oz)] 98.4 kg (216 lb 14.9 oz) (03/23 0300) HEMODYNAMICS: Hemodynamically stable  HR ok off cardiazem drip    VENTILATOR SETTINGS: Nasal cannula oxygen   INTAKE / OUTPUT: Intake/Output     03/22 0701 - 03/23 0700 03/23 0701 - 03/24 0700   P.O.     I.V. (mL/kg) 2571 (26.1) 75 (0.8)   Other     IV Piggyback 300    Total Intake(mL/kg) 2871 (29.2) 75 (0.8)   Urine (mL/kg/hr) 945 (0.4) 150 (0.5)   Total Output 945 150   Net +1926 -75        Stool Occurrence 1 x 1 x     PHYSICAL EXAMINATION: General:  Mildly obese heavyset white  male in no acute distress Neuro:  Somnolent on Precedex drip had been agitated earlier this am and pulled out IV HEENT:  No jugular venous distention no lymphadenopathy no thyromegaly dry mucous membranes short neck Cardiovascular:  Regular rate and rhythm without S3 normal S1-S2 no murmur rub heave or gallop Lungs:  Clear Abdomen:  Soft nontender bowel sounds hypoactive Musculoskeletal:  Full range of motion no joint deformity Skin:  Clear  LABS:  Recent Labs Lab 08/18/12 1634  08/18/12 1958 08/19/12 0249 08/19/12 0817 08/19/12 1359 08/19/12 1955 08/20/12 0345  HGB 13.9  --   --   --  11.4*  --   --  10.4*  WBC 6.8  --   --   --  3.7*  --   --  3.0*  PLT 111*  --   --   --  94*  --   --  96*  NA 127*  --   --  129* 134*  --   --  134*  K 2.7*  --   --  2.9* 3.8  --   --  3.5  CL 85*  --   --  94* 101  --   --  102  CO2 23  --   --  24 25  --   --  23  GLUCOSE 226*  --   --  127* 113*  --   --  105*  BUN 14  --   --  11 11  --   --  10  CREATININE 0.97  --   --  0.83 0.97  --   --  0.89  CALCIUM 10.1  --   --  8.7 8.1*  --   --  7.9*  MG  --   --  0.9* 2.3  --   --   --   --   AST 64*  --   --   --   --   --   --  34  ALT 84*  --   --   --   --   --   --  44  ALKPHOS 28*  --   --   --   --   --   --  19*  BILITOT 0.8  --   --   --   --   --   --  0.4  PROT 7.7  --   --   --   --   --   --  5.5*  ALBUMIN 3.6  --   --   --   --   --   --  2.5*  TROPONINI  --   < > 1.69* 1.09* 0.92* 0.66* 0.77*  --   < > = values in this interval not displayed.  Recent Labs Lab 08/19/12 1155 08/19/12 1549 08/19/12 2051 08/20/12 0026 08/20/12 0419  GLUCAP 123* 104* 99 110* 99    CXR: 08/18/2012: No acute disease   No film 3/23  ASSESSMENT / PLAN: Principal Problem:   Alcohol withdrawal Active Problems:   Atrial fibrillation with RVR   Hyperglycemia   Hypokalemia   Nausea and vomiting   Alcohol abuse   Encephalopathy acute   PULMONARY A: Mild hypoxemia do to mild  hypoventilation P:   Titrate oxygen F/u CXR  CARDIOVASCULAR A: Atrial fibrillation with rapid ventricular response now converted to normal sinus rhythm on cardiazem, but repeated Afib RVR overnight 3/22 Positive troponin Hypertension P:  Continue Cardizem, change to po Takes zebeta at home, resume here Obtain echocardiogram obtaine ECG and obtain cardiology consult  RENAL A:  Mild hyponatremia do to alcoholism P:   Monitor electrolyte Continue saline IV fluids reduce rate   GASTROINTESTINAL A:  History of emesis and mild volume depletion P:   PO proton pump inhibitor Antiemetics prn Advance diet as tolerated Check pancreatic enzymes  HEMATOLOGIC A:  Progressive anemia ?GI losses P:  Monitor PPI   INFECTIOUS A:  No acute infectious disease issue P:   Monitor  ENDOCRINE A:  No endocrine issue   P:   Monitor SSI, CBGs ok   NEUROLOGIC A:   acute alcohol withdrawal with associated encephalopathy P:   Precedex drip>>try to wean to off Ativan po on schedule as a bridge Sitter in room if available  TODAY'S SUMMARY: 42 year old white male with acute alcohol withdrawal. Plan to try to wean precedex to off , start scheduled ativan, advance diet, redirect the pt and obtain a sitter in room.  Obtain echo and cardiology consult.  Obtain ecg again.     I have personally obtained a history, examined the patient, evaluated laboratory and imaging results, formulated the assessment and plan and placed orders. CRITICAL CARE: The patient is critically ill with multiple organ systems failure and requires high complexity decision making for assessment and support, frequent evaluation and titration of therapies, application  of advanced monitoring technologies and extensive interpretation of multiple databases. Critical Care Time devoted to patient care services described in this note is 40 minutes.   Dorcas Carrow Pulmonary and Critical Care Medicine [facility]   [phone]  Cell  [phone]  If no response or cell goes to voicemail, call beeper [phone]   08/20/2012, 10:19 AM

## 2012-08-20 NOTE — Progress Notes (Signed)
Agitation   Haldol 5 mg orders

## 2012-08-20 NOTE — Progress Notes (Signed)
Pt very agitated and fighting, E-link MD aware. Conley Rolls, RN

## 2012-08-20 NOTE — Consult Note (Signed)
CARDIOLOGY CONSULT NOTE  Patient ID: Steven Cooke MRN: [id number] DOB/AGE: 10/05/1970 42 y.o.  Admit date: 08/18/2012  Primary Cardiologistm  Narda Rutherford Reason for Consultation  HPI:   The patient is being treated for acute alcohol withdrawal. He has rapid atrial fibrillation. The EKG with rapid atrial fibrillation reveals increased voltage and diffuse nonspecific ST-T wave changes. His first troponin was 1.6. It has come down steadily to 0.77. There is no prior documented coronary disease.   Past Medical History  Diagnosis Date  . Alcohol abuse   . Hypertension   . Reflux   . Gout   . Hyperlipidemia   . GERD (gastroesophageal reflux disease)     Family History  Problem Relation Age of Onset  . CAD Father     History   Social History  . Marital Status: Married    Spouse Name: N/A    Number of Children: N/A  . Years of Education: N/A   Occupational History  . Not on file.   Social History Main Topics  . Smoking status: Never Smoker   . Smokeless tobacco: Never Used  . Alcohol Use: Yes     Comment: heavy use  . Drug Use: No  . Sexually Active: Not on file   Other Topics Concern  . Not on file   Social History Narrative  . No narrative on file    Past Surgical History  Procedure Laterality Date  . No past surgeries       Prescriptions prior to admission  Medication Sig Dispense Refill  . bisoprolol (ZEBETA) 5 MG tablet Take 5 mg by mouth daily.      . citalopram (CELEXA) 20 MG tablet Take 20 mg by mouth daily.      Marland Kitchen dexlansoprazole (DEXILANT) 60 MG capsule Take 60 mg by mouth daily.      . fenofibrate 160 MG tablet Take 160 mg by mouth daily.      Marland Kitchen LORazepam (ATIVAN) 1 MG tablet Take 1 mg by mouth once. Given at [facility]        Review of systems:  The patient is obtunded and agitated from his current withdrawal state. Further review of systems cannot be obtained.  Physical Exam: Blood pressure 126/75, pulse 80, temperature 98.5 F  (36.9 C), temperature source Oral, resp. rate 21, height 5\' 8"  (1.727 m), weight 216 lb 14.9 oz (98.4 kg), SpO2 96.00%.    The patient is agitated in receiving medicines for his withdrawal. There is no jugulovenous distention. Lungs are clear. Respiratory effort is nonlabored. Cardiac exam reveals S1 and S2. There no clicks or significant murmurs. The abdomen is soft. It is no significant peripheral edema.   Labs:   Lab Results  Component Value Date   WBC 3.0* 08/20/2012   HGB 10.4* 08/20/2012   HCT 30.8* 08/20/2012   MCV 97.5 08/20/2012   PLT 96* 08/20/2012    Recent Labs Lab 08/20/12 0345  NA 134*  K 3.5  CL 102  CO2 23  BUN 10  CREATININE 0.89  CALCIUM 7.9*  PROT 5.5*  BILITOT 0.4  ALKPHOS 19*  ALT 44  AST 34  GLUCOSE 105*   Lab Results  Component Value Date   TROPONINI 0.77* 08/19/2012     EKG:    EKG reveals rapid atrial fibrillation. There are diffuse nonspecific ST-T wave changes that are probably rate related. We'll try to obtain an EKG with the patient is in sinus rhythm.  ASSESSMENT AND PLAN:     Alcohol withdrawal     The patient is being treated aggressively by the critical care medicine team. This is stabilizing him.    Atrial fibrillation with RVR    The patient has converted to sinus rhythm. He has had some periods of being in and out of atrial fib. TSH will be obtained. 2-D echo has been ordered. Currently he is controlled. Using IV Cardizem would be optimal if he has intermittent rapid rates. If this cannot be controlled, IV amiodarone could be used. There is no plan to start that yet.      Elevated troponin    I suspect that his troponin abnormalities are all related to his acute withdrawal. First we'll obtain a 2-D echo. If he has normal systolic left ventricular function he will not need any further in-hospital evaluation.  Jerral Bonito, MD

## 2012-08-20 NOTE — Progress Notes (Signed)
CSW consult received.  Chart review.  Noted that Pt continues to be agitated, delirious.  CSW to continue to follow and will assess Pt once Pt is alert and oriented.  Providence Crosby, LCSWA Clinical Social Work [phone]

## 2012-08-21 ENCOUNTER — Inpatient Hospital Stay (HOSPITAL_COMMUNITY): Payer: BC Managed Care – PPO

## 2012-08-21 DIAGNOSIS — R7989 Other specified abnormal findings of blood chemistry: Secondary | ICD-10-CM

## 2012-08-21 DIAGNOSIS — I4891 Unspecified atrial fibrillation: Secondary | ICD-10-CM

## 2012-08-21 LAB — CBC
Hemoglobin: 12.1 g/dL — ABNORMAL LOW (ref 13.0–17.0)
MCH: 33 pg (ref 26.0–34.0)
MCV: 95.1 fL (ref 78.0–100.0)
RBC: 3.67 MIL/uL — ABNORMAL LOW (ref 4.22–5.81)

## 2012-08-21 LAB — COMPREHENSIVE METABOLIC PANEL
AST: 31 U/L (ref 0–37)
Albumin: 2.5 g/dL — ABNORMAL LOW (ref 3.5–5.2)
Calcium: 8.3 mg/dL — ABNORMAL LOW (ref 8.4–10.5)
Creatinine, Ser: 0.87 mg/dL (ref 0.50–1.35)
GFR calc non Af Amer: >90 mL/min (ref >90)
Total Protein: 5.7 g/dL — ABNORMAL LOW (ref 6.0–8.3)

## 2012-08-21 LAB — GLUCOSE, CAPILLARY
Glucose-Capillary: 136 mg/dL — ABNORMAL HIGH (ref 70–99)
Glucose-Capillary: 120 mg/dL — ABNORMAL HIGH (ref 70–99)

## 2012-08-21 LAB — TSH: TSH: 0.903 u[IU]/mL (ref 0.350–4.500)

## 2012-08-21 MED ORDER — POTASSIUM CHLORIDE CRYS ER 20 MEQ PO TBCR
40.0000 meq | EXTENDED_RELEASE_TABLET | Freq: Once | ORAL | Status: AC
  Administered 2012-08-21: 40 meq via ORAL
  Filled 2012-08-21: qty 2

## 2012-08-21 MED ORDER — POTASSIUM CHLORIDE CRYS ER 20 MEQ PO TBCR: 40.0000 meq | EXTENDED_RELEASE_TABLET | ORAL | Status: DC

## 2012-08-21 NOTE — Progress Notes (Signed)
[id number]/Kendi Defalco, RN, BSN, CCM:  CHART REVIEWED AND UPDATED.  Next chart review due on [id number]. NO DISCHARGE NEEDS PRESENT AT THIS TIME. CASE MANAGEMENT [phone] 

## 2012-08-21 NOTE — Plan of Care (Signed)
Problem: Phase II Progression Outcomes Goal: Progress activity as tolerated unless otherwise ordered Outcome: Progressing Pt tolerated sitting in Chair today.

## 2012-08-21 NOTE — Progress Notes (Signed)
TELEMETRY: Reviewed telemetry pt in NSR: Filed Vitals:   08/20/12 2320 08/21/12 0029 08/21/12 0202 08/21/12 0400  BP:  130/90 132/83   Pulse: 81  81   Temp: 98.3 F (36.8 C)   98.1 F (36.7 C)  TempSrc: Oral   Oral  Resp: 21  16   Height:      Weight:      SpO2: 100%  98%     Intake/Output Summary (Last 24 hours) at 08/21/12 0706 Last data filed at 08/21/12 0400  Gross per 24 hour  Intake 2279.9 ml  Output   1550 ml  Net  729.9 ml    SUBJECTIVE Denies chest pain or SOB. Somewhat agitated. Wants to go home.  LABS: Basic Metabolic Panel:  Recent Labs  84/69/62 1958 08/19/12 0249  08/20/12 0345 08/21/12 0335  NA  --  129*  < > 134* 135  K  --  2.9*  < > 3.5 3.0*  CL  --  94*  < > 102 100  CO2  --  24  < > 23 25  GLUCOSE  --  127*  < > 105* 103*  BUN  --  11  < > 10 4*  CREATININE  --  0.83  < > 0.89 0.87  CALCIUM  --  8.7  < > 7.9* 8.3*  MG 0.9* 2.3  --   --   --   < > = values in this interval not displayed. Liver Function Tests:  Recent Labs  08/20/12 0345 08/21/12 0335  AST 34 31  ALT 44 35  ALKPHOS 19* 20*  BILITOT 0.4 0.4  PROT 5.5* 5.7*  ALBUMIN 2.5* 2.5*   CBC:  Recent Labs  08/18/12 1634  08/20/12 0345 08/21/12 0335  WBC 6.8  < > 3.0* 2.9*  NEUTROABS 5.2  --  1.8  --   HGB 13.9  < > 10.4* 12.1*  HCT 40.4  < > 30.8* 34.9*  MCV 93.7  < > 97.5 95.1  PLT 111*  < > 96* 143*  < > = values in this interval not displayed. Cardiac Enzymes:  Recent Labs  08/19/12 0817 08/19/12 1359 08/19/12 1955  TROPONINI 0.92* 0.66* 0.77*   Hemoglobin A1C:  Recent Labs  08/19/12 0249  HGBA1C 6.5*   Radiology/Studies:  Dg Chest Port 1 View  08/21/2012  *RADIOLOGY REPORT*  Clinical Data: Cough.  Alcohol abuse.  Wheezing.  PORTABLE CHEST - 1 VIEW  Comparison: None.  Findings: No airspace disease or effusion.  Cardiopericardial silhouette and mediastinal contours appear within normal limits. No pneumothorax.  Lung volumes are low.  Basilar  atelectasis.  IMPRESSION: Low volume chest.   Original Report Authenticated By: Andreas Newport, M.D.    ECG: 08/20/12 NSR, normal.  PHYSICAL EXAM General: Well developed, obese, agitated Head: Normal Neck: Negative for carotid bruits. JVD not elevated. Lungs: Bilateral wheezes. Heart: RRR S1 S2 without murmurs, rubs, or gallops.  Abdomen: Soft, non-tender, non-distended with normoactive bowel sounds.  Msk:  Strength and tone appears normal for age. Extremities: No clubbing, cyanosis or edema.  Distal pedal pulses are 2+ and equal bilaterally. Neuro: Alert and oriented X 3. Moves all extremities spontaneously.   ASSESSMENT AND PLAN: 1. Atrial fibrillation- secondary to Etoh withdrawal. Now converted to NSR. On Zbeta and cardizem po. Echo pending today. TSH pending. 2. Elevated troponin probably related to acute Etoh withdrawal. Will assess LV function on Echo. If LV function is good would not recommend further ischemic work  up this admission. 3. ETOH withdrawal with encephalopathy. 4. Pancytopenia probably related to Etoh abuse.  Principal Problem:   Alcohol withdrawal Active Problems:   Atrial fibrillation with RVR   Hyperglycemia   Hypokalemia   Nausea and vomiting   Alcohol abuse   Encephalopathy acute   Elevated troponin    Signed, Edrik Rundle [place] MD,FACC 08/21/2012 7:13 AM

## 2012-08-21 NOTE — Progress Notes (Signed)
eLink Physician-Brief Progress Note Patient Name: Steven Cooke DOB: [birth year] MRN: [id number]  Date of Service  08/21/2012   HPI/Events of Note  Hypokalemia   eICU Interventions  Potassium replaced   Intervention Category Minor Interventions: Electrolytes abnormality - evaluation and management  DETERDING,ELIZABETH 08/21/2012, 4:32 AM

## 2012-08-21 NOTE — Clinical Social Work Note (Signed)
CSW met with Pt briefly in attempt to complete ETOH assessment. CSW unable to complete as Pt had to use the bathroom and got up abruptly. Pt admits to excessive consumption of vodka. CSW will try again to assess in the am.   Doreen Salvage, LCSW ICU/Stepdown Clinical Social Worker [facility] Cell [phone] Hours 8am-1200pm M-F

## 2012-08-21 NOTE — Progress Notes (Signed)
  Echocardiogram 2D Echocardiogram has been performed.  Cathie Beams 08/21/2012, 9:23 AM

## 2012-08-21 NOTE — Progress Notes (Signed)
PULMONARY  / CRITICAL CARE MEDICINE  Name: Steven Cooke MRN: [id number] DOB: 04-29-1971    ADMISSION DATE:  08/18/2012 CONSULTATION DATE:  08/21/2012   REFERRING MD :  TRH  CHIEF COMPLAINT:  Acute alcohol withdrawal  BRIEF PATIENT DESCRIPTION: 42 year old white male with history of alcohol abuse and heavy drinking over the past 3 months presents now with increasing hallucinations and agitation x 5 days. He had increased nausea vomiting 5 days ago. Patient then drank a large amount of liquor 2 days prior to admission. And then one day prior to admission was still drinking.  The pt complained  of palpitations and found to have atrial fibrillation with rapid ventricular response. Now on IV Cardizem bolus IV Cardizem drip and also given IV Ativan. The patient dud  not respond to this treatment and had to be placed on Precedex over night of 3/21. Critical care is now consult.  SIGNIFICANT EVENTS / STUDIES:  none  LINES / TUBES: PIV  CULTURES: none  ANTIBIOTICS: None  SUBJECTIVE:  Better, more alert   VITAL SIGNS: Temp:  [98.1 F (36.7 C)-98.6 F (37 C)] 98.2 F (36.8 C) (03/24 0800) Pulse Rate:  [78-88] 81 (03/24 0202) Resp:  [15-27] 16 (03/24 0202) BP: (116-136)/(71-90) 132/83 mmHg (03/24 0202) SpO2:  [90 %-100 %] 98 % (03/24 0202) HEMODYNAMICS: Hemodynamically stable and on Cardizem drip   VENTILATOR SETTINGS: Nasal cannula oxygen   INTAKE / OUTPUT: Intake/Output     03/23 0701 - 03/24 0700 03/24 0701 - 03/25 0700   P.O. 1040 360   I.V. (mL/kg) 1404 (14.3)    IV Piggyback     Total Intake(mL/kg) 2444 (24.8) 360 (3.7)   Urine (mL/kg/hr) 1550 (0.7)    Total Output 1550     Net +894 +360        Urine Occurrence 2 x    Stool Occurrence 5 x     Room air  PHYSICAL EXAMINATION: General:  Mildly obese heavyset white male in no acute distress Neuro:  More alert HEENT:  No jugular venous distention no lymphadenopathy no thyromegaly dry mucous membranes short  neck Cardiovascular:  Regular rate and rhythm without S3 normal S1-S2 no murmur rub heave or gallop Lungs:  Clear after cough.  Abdomen:  Soft nontender bowel sounds hypoactive Musculoskeletal:  Full range of motion no joint deformity Skin:  Clear  LABS:  Recent Labs Lab 08/19/12 0817 08/20/12 0345 08/21/12 0335  NA 134* 134* 135  K 3.8 3.5 3.0*  CL 101 102 100  CO2 25 23 25   BUN 11 10 4*  CREATININE 0.97 0.89 0.87  GLUCOSE 113* 105* 103*    Recent Labs Lab 08/19/12 0817 08/20/12 0345 08/21/12 0335  HGB 11.4* 10.4* 12.1*  HCT 34.2* 30.8* 34.9*  WBC 3.7* 3.0* 2.9*  PLT 94* 96* 143*    Recent Labs Lab 08/20/12 1238 08/20/12 1548 08/20/12 1921 08/20/12 2212 08/21/12 0830  GLUCAP 93 120* 125* 115* 136*    CXR: no film  ASSESSMENT / PLAN: Principal Problem:   Alcohol withdrawal Active Problems:   Atrial fibrillation with RVR   Hyperglycemia   Hypokalemia   Nausea and vomiting   Alcohol abuse   Encephalopathy acute   Elevated troponin   PULMONARY A: Mild hypoxemia do to mild hypoventilation P:   Titrate oxygen to off  CARDIOVASCULAR A: Atrial fibrillation with rapid ventricular response now converted to normal sinus rhythm Hypertension P:  Change to  Cardizem oral  F/u echocardiogram Continue beta blocker  RENAL A:  Mild hyponatremia do to alcoholism: better        Hypokalemia  P:   Monitor and replace  Electrolytes as needed Continue saline IV fluids  GASTROINTESTINAL A:  History of emesis and mild volume depletion P:   PO proton pump inhibitor Antiemetics  HEMATOLOGIC A:  No acute hematologic issues P:  Monitor  INFECTIOUS A:  No acute infectious disease issue P:   Monitor  ENDOCRINE A:  No endocrine issue   P:   Monitor  NEUROLOGIC A:   acute alcohol withdrawal with associated encephalopathy P:   Try to wean off precedex  TODAY'S SUMMARY: 42 year old white male with acute alcohol withdrawal. Con IV thiamine and  folic acid. We'll continue beta blockade. We'll continue Cardizem change to po. Will f/u echo and wean precedex to off  I have personally obtained a history, examined the patient, evaluated laboratory and imaging results, formulated the assessment and plan and placed orders. CRITICAL CARE: The patient is critically ill with multiple organ systems failure and requires high complexity decision making for assessment and support, frequent evaluation and titration of therapies, application of advanced monitoring technologies and extensive interpretation of multiple databases. Critical Care Time devoted to patient care services described in this note is 30 minutes.      Dorcas Carrow Beeper  [phone]  Cell  [phone]  If no response or cell goes to voicemail, call beeper [phone]  08/21/2012, 10:58 AM

## 2012-08-22 ENCOUNTER — Inpatient Hospital Stay (HOSPITAL_COMMUNITY): Payer: BC Managed Care – PPO

## 2012-08-22 DIAGNOSIS — F1020 Alcohol dependence, uncomplicated: Secondary | ICD-10-CM

## 2012-08-22 LAB — COMPREHENSIVE METABOLIC PANEL
AST: 28 U/L (ref 0–37)
BUN: 3 mg/dL — ABNORMAL LOW (ref 6–23)
CO2: 23 mEq/L (ref 19–32)
Calcium: 9.1 mg/dL (ref 8.4–10.5)
Creatinine, Ser: 0.85 mg/dL (ref 0.50–1.35)
GFR calc Af Amer: >90 mL/min (ref >90)
GFR calc non Af Amer: >90 mL/min (ref >90)
Glucose, Bld: 119 mg/dL — ABNORMAL HIGH (ref 70–99)

## 2012-08-22 LAB — CBC
MCH: 32.5 pg (ref 26.0–34.0)
MCV: 95.3 fL (ref 78.0–100.0)
Platelets: 203 10*3/uL (ref 150–400)
RBC: 3.81 MIL/uL — ABNORMAL LOW (ref 4.22–5.81)

## 2012-08-22 LAB — GLUCOSE, CAPILLARY
Glucose-Capillary: 122 mg/dL — ABNORMAL HIGH (ref 70–99)
Glucose-Capillary: 130 mg/dL — ABNORMAL HIGH (ref 70–99)
Glucose-Capillary: 112 mg/dL — ABNORMAL HIGH (ref 70–99)

## 2012-08-22 MED ORDER — THIAMINE HCL 100 MG/ML IJ SOLN: 500.0000 mg | Freq: Three times a day (TID) | INTRAMUSCULAR | Status: DC

## 2012-08-22 MED ORDER — LORAZEPAM 2 MG/ML IJ SOLN
1.0000 mg | INTRAMUSCULAR | Status: DC | PRN
  Administered 2012-08-22: 1 mg via INTRAVENOUS

## 2012-08-22 MED ORDER — THIAMINE HCL 100 MG/ML IJ SOLN: INTRAMUSCULAR | Status: DC

## 2012-08-22 MED ORDER — DEXMEDETOMIDINE BOLUS VIA INFUSION
1.0000 ug/kg | Freq: Once | INTRAVENOUS | Status: AC
  Administered 2012-08-22: 80.4 ug via INTRAVENOUS

## 2012-08-22 MED ORDER — DEXMEDETOMIDINE HCL IN NACL 200 MCG/50ML IV SOLN
0.4000 ug/kg/h | INTRAVENOUS | Status: DC
  Administered 2012-08-22: 0.7 ug/kg/h via INTRAVENOUS
  Administered 2012-08-22: 0.2 ug/kg/h via INTRAVENOUS
  Administered 2012-08-22: 1.2 ug/kg/h via INTRAVENOUS
  Filled 2012-08-22 (×4): qty 50

## 2012-08-22 MED ORDER — THIAMINE HCL 100 MG/ML IJ SOLN: 250.0000 mg | Freq: Every day | INTRAMUSCULAR | Status: DC

## 2012-08-22 MED ORDER — DEXMEDETOMIDINE HCL IN NACL 200 MCG/50ML IV SOLN
0.4000 ug/kg/h | INTRAVENOUS | Status: DC
  Administered 2012-08-22: 1.2 ug/kg/h via INTRAVENOUS
  Filled 2012-08-22: qty 50

## 2012-08-22 MED ORDER — THIAMINE HCL 100 MG/ML IJ SOLN
Freq: Three times a day (TID) | INTRAVENOUS | Status: DC
  Administered 2012-08-22 – 2012-08-24 (×6): via INTRAVENOUS
  Filled 2012-08-22 (×7): qty 5

## 2012-08-22 MED ORDER — HALOPERIDOL LACTATE 5 MG/ML IJ SOLN
INTRAMUSCULAR | Status: AC
  Filled 2012-08-22: qty 2

## 2012-08-22 MED ORDER — HALOPERIDOL LACTATE 5 MG/ML IJ SOLN
10.0000 mg | Freq: Once | INTRAMUSCULAR | Status: AC
  Administered 2012-08-22: 10 mg via INTRAMUSCULAR

## 2012-08-22 MED ORDER — PANTOPRAZOLE SODIUM 40 MG IV SOLR
40.0000 mg | Freq: Every day | INTRAVENOUS | Status: DC
  Administered 2012-08-22: 40 mg via INTRAVENOUS
  Filled 2012-08-22 (×2): qty 40

## 2012-08-22 MED ORDER — DIPHENHYDRAMINE HCL 50 MG/ML IJ SOLN
50.0000 mg | Freq: Four times a day (QID) | INTRAMUSCULAR | Status: DC | PRN
  Filled 2012-08-22: qty 1

## 2012-08-22 MED ORDER — DEXMEDETOMIDINE HCL IN NACL 400 MCG/100ML IV SOLN
0.4000 ug/kg/h | INTRAVENOUS | Status: DC
  Administered 2012-08-22 (×2): 1 ug/kg/h via INTRAVENOUS
  Administered 2012-08-22: 1.2 ug/kg/h via INTRAVENOUS
  Administered 2012-08-23 (×2): 1 ug/kg/h via INTRAVENOUS
  Filled 2012-08-22 (×6): qty 100

## 2012-08-22 MED ORDER — HALOPERIDOL LACTATE 5 MG/ML IJ SOLN
10.0000 mg | INTRAMUSCULAR | Status: DC | PRN
  Administered 2012-08-22 (×3): 10 mg via INTRAVENOUS
  Filled 2012-08-22: qty 1
  Filled 2012-08-22 (×2): qty 2
  Filled 2012-08-22: qty 1

## 2012-08-22 MED ORDER — HALOPERIDOL LACTATE 5 MG/ML IJ SOLN
5.0000 mg | Freq: Four times a day (QID) | INTRAMUSCULAR | Status: DC | PRN
  Administered 2012-08-22 – 2012-08-23 (×2): 5 mg via INTRAMUSCULAR
  Filled 2012-08-22 (×2): qty 1

## 2012-08-22 MED ORDER — DILTIAZEM HCL ER COATED BEADS 240 MG PO CP24
240.0000 mg | ORAL_CAPSULE | Freq: Every day | ORAL | Status: DC
  Filled 2012-08-22: qty 1

## 2012-08-22 MED ORDER — LORAZEPAM 2 MG/ML IJ SOLN
2.0000 mg | INTRAMUSCULAR | Status: DC
  Administered 2012-08-22 – 2012-08-24 (×12): 2 mg via INTRAVENOUS
  Filled 2012-08-22 (×12): qty 1

## 2012-08-22 MED ORDER — BENZTROPINE MESYLATE 1 MG/ML IJ SOLN
1.0000 mg | Freq: Two times a day (BID) | INTRAMUSCULAR | Status: DC | PRN
  Filled 2012-08-22: qty 1

## 2012-08-22 MED ORDER — DIAZEPAM 5 MG/ML IJ SOLN
INTRAMUSCULAR | Status: AC
  Administered 2012-08-22: 5 mg
  Filled 2012-08-22: qty 2

## 2012-08-22 NOTE — Clinical Social Work Psychosocial (Signed)
Clinical Social Work Department BRIEF PSYCHOSOCIAL ASSESSMENT 08/22/2012  Patient:  CALI, CUARTAS     Account Number:  [id number]     Admit date:  08/18/2012  Clinical Social Worker:  Vennie Homans, Theresia Majors  Date/Time:  08/22/2012 12:00 M  Referred by:  Physician  Date Referred:  08/21/2012 Referred for  Substance Abuse   Other Referral:   Interview type:  Patient Other interview type:   wife- Anquan Azzarello- over the phone    PSYCHOSOCIAL DATA Living Status:  FAMILY Admitted from facility:   Level of care:   Primary support name:  Armonte Tortorella Primary support relationship to patient:  SPOUSE Degree of support available:   good from spouse  lives with wife and three children, son-15yo, 10yo daughter and 8yo.    CURRENT CONCERNS Current Concerns  Substance Abuse  Adjustment to Illness   Other Concerns:    SOCIAL WORK ASSESSMENT / PLAN CSW attempted to meet with Pt yesterday and he was still not completely oriented. Today, Pt is agitated. CSW called wife to check in and get background on situation and offer support.  Per wife, Pt began drinking heavily about two years ago. He drinks about a fifth of vodka a day per her report. She feels it could be more. Wife feels Pt has become more and more depressed as a result of several unresolved grief events. He lost his dad to cancer six years ago. He also lost his best friend, who was married to his sister and then divorced sister about two years ago. Per wife, both of these issues lead to increased drinking.  Wife is hopeful Pt will agree to go to inpt treatment at discharge and he has insurance to assist with this process.  CSW discussed coping techniques and resources to assist children and herself. CSW will follow for support and tx options.   Assessment/plan status:  Psychosocial Support/Ongoing Assessment of Needs Other assessment/ plan:   Information/referral to community resources:   [facility], [facility], inpt tx  if Pt will agree.    PATIENT'S/FAMILY'S RESPONSE TO PLAN OF CARE: CSW will follow and assist with tx plan. Pt needs to be able to meet with CSW, but should have health ins that could assist with rehab. CSW to follow. Wife coping adequately and agrees to seek out CSW as  needed.    Doreen Salvage, LCSW ICU/Stepdown Clinical Social Worker [facility] Cell [phone] Hours 8am-1200pm M-F

## 2012-08-22 NOTE — Consult Note (Signed)
Reason for Consult:Delirum tremens and alcohol dependence Referring Physician: Dr. Ebony Cargo is an 42 y.o. male.  HPI: Patient was seen and chart reviewed. Reportedly patient has been suffering with the nausea, vomiting, visual hallucinations, agitation and combative behavior since he stopped drinking. Patient was unable to participate in the assessment because of her sedation secondary to medications. Case was discussed with the staff notes and the patient friend who was at bedside. Reportedly patient lives in [place] and works in Journalist, newspaper shop, and he lives with his wife Victorino Dike and 3 younger children's ages 48 , 44 and 79. reportedly patient father was accidentally T. right working out of Building services engineer. Patient has been drinking over 6 months of heavy and than decided to stop, which caused him severe alcohol withdrawal symptoms. Patient was combative, extremely, agitated this morning required chemical and physical  restraints. Patient urine drug screen was negative and blood alcohol level is less than 11   MSE:  Patient was lying down on his bed without distress and Headend was elevated.  Past Medical History  Diagnosis Date  . Alcohol abuse   . Hypertension   . Reflux   . Gout   . Hyperlipidemia   . GERD (gastroesophageal reflux disease)     Past Surgical History  Procedure Laterality Date  . No past surgeries      Family History  Problem Relation Age of Onset  . CAD Father     Social History:  reports that he has never smoked. He has never used smokeless tobacco. He reports that  drinks alcohol. He reports that he does not use illicit drugs.  Allergies: No Known Allergies  Medications: I have reviewed the patient's current medications.  Results for orders placed during the hospital encounter of 08/18/12 (from the past 48 hour(s))  GLUCOSE, CAPILLARY     Status: Abnormal   Collection Time    08/20/12  3:48 PM      Result Value Range    Glucose-Capillary 120 (*) 70 - 99 mg/dL  GLUCOSE, CAPILLARY     Status: Abnormal   Collection Time    08/20/12  7:21 PM      Result Value Range   Glucose-Capillary 125 (*) 70 - 99 mg/dL   Comment 1 Documented in Chart     Comment 2 Notify RN    GLUCOSE, CAPILLARY     Status: Abnormal   Collection Time    08/20/12 10:12 PM      Result Value Range   Glucose-Capillary 115 (*) 70 - 99 mg/dL   Comment 1 Documented in Chart     Comment 2 Notify RN    COMPREHENSIVE METABOLIC PANEL     Status: Abnormal   Collection Time    08/21/12  3:35 AM      Result Value Range   Sodium 135  135 - 145 mEq/L   Potassium 3.0 (*) 3.5 - 5.1 mEq/L   Chloride 100  96 - 112 mEq/L   CO2 25  19 - 32 mEq/L   Glucose, Bld 103 (*) 70 - 99 mg/dL   BUN 4 (*) 6 - 23 mg/dL   Creatinine, Ser 4.78  0.50 - 1.35 mg/dL   Calcium 8.3 (*) 8.4 - 10.5 mg/dL   Total Protein 5.7 (*) 6.0 - 8.3 g/dL   Albumin 2.5 (*) 3.5 - 5.2 g/dL   AST 31  0 - 37 U/L   ALT 35  0 - 53 U/L  Alkaline Phosphatase 20 (*) 39 - 117 U/L   Total Bilirubin 0.4  0.3 - 1.2 mg/dL   GFR calc non Af Amer >90  >90 mL/min   GFR calc Af Amer >90  >90 mL/min   Comment:            The eGFR has been calculated     using the CKD EPI equation.     This calculation has not been     validated in all clinical     situations.     eGFR's persistently     <90 mL/min signify     possible Chronic Kidney Disease.  CBC     Status: Abnormal   Collection Time    08/21/12  3:35 AM      Result Value Range   WBC 2.9 (*) 4.0 - 10.5 K/uL   RBC 3.67 (*) 4.22 - 5.81 MIL/uL   Hemoglobin 12.1 (*) 13.0 - 17.0 g/dL   HCT 16.1 (*) 09.6 - 04.5 %   MCV 95.1  78.0 - 100.0 fL   MCH 33.0  26.0 - 34.0 pg   MCHC 34.7  30.0 - 36.0 g/dL   RDW 40.9  81.1 - 91.4 %   Platelets 143 (*) 150 - 400 K/uL   Comment: DELTA CHECK NOTED     REPEATED TO VERIFY  TSH     Status: None   Collection Time    08/21/12  3:35 AM      Result Value Range   TSH 0.903  0.350 - 4.500 uIU/mL   GLUCOSE, CAPILLARY     Status: Abnormal   Collection Time    08/21/12  8:30 AM      Result Value Range   Glucose-Capillary 136 (*) 70 - 99 mg/dL   Comment 1 Documented in Chart     Comment 2 Notify RN    GLUCOSE, CAPILLARY     Status: Abnormal   Collection Time    08/21/12 12:16 PM      Result Value Range   Glucose-Capillary 120 (*) 70 - 99 mg/dL   Comment 1 Documented in Chart     Comment 2 Notify RN    GLUCOSE, CAPILLARY     Status: Abnormal   Collection Time    08/21/12  5:51 PM      Result Value Range   Glucose-Capillary 152 (*) 70 - 99 mg/dL   Comment 1 Documented in Chart     Comment 2 Notify RN    GLUCOSE, CAPILLARY     Status: Abnormal   Collection Time    08/21/12 10:33 PM      Result Value Range   Glucose-Capillary 135 (*) 70 - 99 mg/dL   Comment 1 Documented in Chart     Comment 2 Notify RN    CBC     Status: Abnormal   Collection Time    08/22/12  3:40 AM      Result Value Range   WBC 3.7 (*) 4.0 - 10.5 K/uL   RBC 3.81 (*) 4.22 - 5.81 MIL/uL   Hemoglobin 12.4 (*) 13.0 - 17.0 g/dL   HCT 78.2 (*) 95.6 - 21.3 %   MCV 95.3  78.0 - 100.0 fL   MCH 32.5  26.0 - 34.0 pg   MCHC 34.2  30.0 - 36.0 g/dL   RDW 08.6  57.8 - 46.9 %   Platelets 203  150 - 400 K/uL   Comment: DELTA CHECK NOTED  COMPREHENSIVE METABOLIC PANEL  Status: Abnormal   Collection Time    08/22/12  3:40 AM      Result Value Range   Sodium 135  135 - 145 mEq/L   Potassium 3.5  3.5 - 5.1 mEq/L   Chloride 102  96 - 112 mEq/L   CO2 23  19 - 32 mEq/L   Glucose, Bld 119 (*) 70 - 99 mg/dL   BUN 3 (*) 6 - 23 mg/dL   Creatinine, Ser 4.69  0.50 - 1.35 mg/dL   Calcium 9.1  8.4 - 62.9 mg/dL   Total Protein 6.0  6.0 - 8.3 g/dL   Albumin 2.9 (*) 3.5 - 5.2 g/dL   AST 28  0 - 37 U/L   ALT 33  0 - 53 U/L   Alkaline Phosphatase 23 (*) 39 - 117 U/L   Total Bilirubin 0.3  0.3 - 1.2 mg/dL   GFR calc non Af Amer >90  >90 mL/min   GFR calc Af Amer >90  >90 mL/min   Comment:            The eGFR has been  calculated     using the CKD EPI equation.     This calculation has not been     validated in all clinical     situations.     eGFR's persistently     <90 mL/min signify     possible Chronic Kidney Disease.  GLUCOSE, CAPILLARY     Status: Abnormal   Collection Time    08/22/12  8:19 AM      Result Value Range   Glucose-Capillary 122 (*) 70 - 99 mg/dL   Comment 1 Documented in Chart     Comment 2 Notify RN    GLUCOSE, CAPILLARY     Status: Abnormal   Collection Time    08/22/12 12:19 PM      Result Value Range   Glucose-Capillary 130 (*) 70 - 99 mg/dL   Comment 1 Documented in Chart     Comment 2 Notify RN      Dg Chest Port 1 View  08/21/2012  *RADIOLOGY REPORT*  Clinical Data: Cough.  Alcohol abuse.  Wheezing.  PORTABLE CHEST - 1 VIEW  Comparison: None.  Findings: No airspace disease or effusion.  Cardiopericardial silhouette and mediastinal contours appear within normal limits. No pneumothorax.  Lung volumes are low.  Basilar atelectasis.  IMPRESSION: Low volume chest.   Original Report Authenticated By: Andreas Newport, M.D.     Positive for anxiety, bad mood and excessive alcohol consumption Blood pressure 145/100, pulse 61, temperature 97.5 F (36.4 C), temperature source Axillary, resp. rate 18, height 5\' 8"  (1.727 m), weight 216 lb 14.9 oz (98.4 kg), SpO2 98.00%.   Assessment/Plan: Alcohol withdrawal Alcohol dependence  Recommendations: Patient will benefit from Librium protocol and small dose of antipsychotics like haldol 5 mg and Cogentin 1 mg Q6 hours PRN for psychosis and agitation. Appreciate  psychiatric consult on this patient. Psych consult follow as needed.    Rayn Shorb,JANARDHAHA R. 08/22/2012, 3:34 PM

## 2012-08-22 NOTE — Consult Note (Signed)
NEURO HOSPITALIST CONSULT NOTE    Reason for Consult: Delerium  HPI:                                                                                                                                         Much of history obtained from chart   Steven Cooke is an 42 y.o. male a history of "Alcohol abuse and heavy drinking over the past 3 months or more who presented to hospital with complaints of hallucinations and agitation X 4-5 days. Per chart, he unable to hold down any food, liquids or alcohol. Per family he got better 2 days ago after he drank 1 small airplane bottle of liquor. His last drink was 1 day prior to admission, and his family reports he normally drinks all day long. He also has had complaints of palpitations and his heart rate was found to be in the 140s in the ED, and he was in Atrial fibrillation." After admission it was noted he had ST changes on EKG in addition to K+2.7 and Magnesium 0.9. Patient was started on a Cardizem drip for rate control. Patient then became very agitated and required Ativan for sedation which later was changed to Precedex 08/18/12. Cardiology evaluated patient and deamed his A-fib to be secondary to his ETOH withdrawal. Patient had converted to NSR on 08/21/12. Neurology was consulted to further evaluation of confusion.   At time of consultation, patient had just received 5 mg of Valium due to sever agitation.  Patient was brought to MRI in hopes with sedation MRI was able to obtain however patient became severely agitated again and image was not able to be obtained after 2 more mg given.    Past Medical History  Diagnosis Date  . Alcohol abuse   . Hypertension   . Reflux   . Gout   . Hyperlipidemia   . GERD (gastroesophageal reflux disease)     Past Surgical History  Procedure Laterality Date  . No past surgeries      Family History  Problem Relation Age of Onset  . CAD Father      Social History:  reports that  he has never smoked. He has never used smokeless tobacco. He reports that  drinks alcohol. He reports that he does not use illicit drugs.  No Known Allergies  MEDICATIONS:  Prior to Admission:  Prescriptions prior to admission  Medication Sig Dispense Refill  . bisoprolol (ZEBETA) 5 MG tablet Take 5 mg by mouth daily.      . citalopram (CELEXA) 20 MG tablet Take 20 mg by mouth daily.      Marland Kitchen dexlansoprazole (DEXILANT) 60 MG capsule Take 60 mg by mouth daily.      . fenofibrate 160 MG tablet Take 160 mg by mouth daily.      Marland Kitchen LORazepam (ATIVAN) 1 MG tablet Take 1 mg by mouth once. Given at [facility]       Scheduled: . bisoprolol  5 mg Oral Daily  . diltiazem  60 mg Oral Q6H  . enoxaparin (LOVENOX) injection  40 mg Subcutaneous Q2000  . folic acid  1 mg Intravenous Daily  . haloperidol lactate      . insulin aspart  0-9 Units Subcutaneous TID AC & HS  . LORazepam  1 mg Oral Q6H  . pantoprazole  40 mg Oral Q1200  . thiamine  100 mg Intravenous Daily   Or  . thiamine  100 mg Oral Daily     ROS:                                                                                                                                       History obtained from unobtainable from patient due to mental status  Blood pressure 126/81, pulse 63, temperature 98.8 F (37.1 C), temperature source Oral, resp. rate 18, height 5\' 8"  (1.727 m), weight 98.4 kg (216 lb 14.9 oz), SpO2 94.00%.   Neurologic Examination:                                                                                                      Mental Status: Alert, not oriented to place, year--he believes he has a seat belt on and continues to talk about when they are replaced. HE will follow some commands but then becomes drowsy. Speech slightly slurred but has also received Haldol, ativan and valium.    Cranial  Nerves: II: Discs not visualized on exam due to aggitation; Visual fields grossly normal, pupils equal, round, reactive to light and accommodation III,IV, VI: ptosis not present, extra-ocular motions intact bilaterally V,VII: smile symmetric, facial light touch sensation normal bilaterally VIII: hearing normal bilaterally IX,X: gag reflex present XI: bilateral shoulder shrug XII: midline tongue extension Motor: Moving all extremities with full 5/5 strength Tone and bulk:normal tone throughout; no atrophy noted Sensory:  Pinprick and light touch intact throughout, bilaterally Deep Tendon Reflexes: 2+ and symmetric throughout Plantars: Right: downgoing   Left: downgoing Cerebellar: In all 4 restraints--unable to assess CV: pulses palpable throughout    No results found for this basename: cbc, bmp, coags, chol, tri, ldl, hga1c    Results for orders placed during the hospital encounter of 08/18/12 (from the past 48 hour(s))  GLUCOSE, CAPILLARY     Status: None   Collection Time    08/20/12 12:38 PM      Result Value Range   Glucose-Capillary 93  70 - 99 mg/dL   Comment 1 Documented in Chart     Comment 2 Notify RN    GLUCOSE, CAPILLARY     Status: Abnormal   Collection Time    08/20/12  3:48 PM      Result Value Range   Glucose-Capillary 120 (*) 70 - 99 mg/dL  GLUCOSE, CAPILLARY     Status: Abnormal   Collection Time    08/20/12  7:21 PM      Result Value Range   Glucose-Capillary 125 (*) 70 - 99 mg/dL   Comment 1 Documented in Chart     Comment 2 Notify RN    GLUCOSE, CAPILLARY     Status: Abnormal   Collection Time    08/20/12 10:12 PM      Result Value Range   Glucose-Capillary 115 (*) 70 - 99 mg/dL   Comment 1 Documented in Chart     Comment 2 Notify RN    COMPREHENSIVE METABOLIC PANEL     Status: Abnormal   Collection Time    08/21/12  3:35 AM      Result Value Range   Sodium 135  135 - 145 mEq/L   Potassium 3.0 (*) 3.5 - 5.1 mEq/L   Chloride 100  96 - 112  mEq/L   CO2 25  19 - 32 mEq/L   Glucose, Bld 103 (*) 70 - 99 mg/dL   BUN 4 (*) 6 - 23 mg/dL   Creatinine, Ser 4.54  0.50 - 1.35 mg/dL   Calcium 8.3 (*) 8.4 - 10.5 mg/dL   Total Protein 5.7 (*) 6.0 - 8.3 g/dL   Albumin 2.5 (*) 3.5 - 5.2 g/dL   AST 31  0 - 37 U/L   ALT 35  0 - 53 U/L   Alkaline Phosphatase 20 (*) 39 - 117 U/L   Total Bilirubin 0.4  0.3 - 1.2 mg/dL   GFR calc non Af Amer >90  >90 mL/min   GFR calc Af Amer >90  >90 mL/min   Comment:            The eGFR has been calculated     using the CKD EPI equation.     This calculation has not been     validated in all clinical     situations.     eGFR's persistently     <90 mL/min signify     possible Chronic Kidney Disease.  CBC     Status: Abnormal   Collection Time    08/21/12  3:35 AM      Result Value Range   WBC 2.9 (*) 4.0 - 10.5 K/uL   RBC 3.67 (*) 4.22 - 5.81 MIL/uL   Hemoglobin 12.1 (*) 13.0 - 17.0 g/dL   HCT 09.8 (*) 11.9 - 14.7 %   MCV 95.1  78.0 - 100.0 fL   MCH 33.0  26.0 - 34.0 pg   MCHC  34.7  30.0 - 36.0 g/dL   RDW 04.5  40.9 - 81.1 %   Platelets 143 (*) 150 - 400 K/uL   Comment: DELTA CHECK NOTED     REPEATED TO VERIFY  TSH     Status: None   Collection Time    08/21/12  3:35 AM      Result Value Range   TSH 0.903  0.350 - 4.500 uIU/mL  GLUCOSE, CAPILLARY     Status: Abnormal   Collection Time    08/21/12  8:30 AM      Result Value Range   Glucose-Capillary 136 (*) 70 - 99 mg/dL   Comment 1 Documented in Chart     Comment 2 Notify RN    GLUCOSE, CAPILLARY     Status: Abnormal   Collection Time    08/21/12 12:16 PM      Result Value Range   Glucose-Capillary 120 (*) 70 - 99 mg/dL   Comment 1 Documented in Chart     Comment 2 Notify RN    GLUCOSE, CAPILLARY     Status: Abnormal   Collection Time    08/21/12  5:51 PM      Result Value Range   Glucose-Capillary 152 (*) 70 - 99 mg/dL   Comment 1 Documented in Chart     Comment 2 Notify RN    GLUCOSE, CAPILLARY     Status: Abnormal    Collection Time    08/21/12 10:33 PM      Result Value Range   Glucose-Capillary 135 (*) 70 - 99 mg/dL   Comment 1 Documented in Chart     Comment 2 Notify RN    CBC     Status: Abnormal   Collection Time    08/22/12  3:40 AM      Result Value Range   WBC 3.7 (*) 4.0 - 10.5 K/uL   RBC 3.81 (*) 4.22 - 5.81 MIL/uL   Hemoglobin 12.4 (*) 13.0 - 17.0 g/dL   HCT 91.4 (*) 78.2 - 95.6 %   MCV 95.3  78.0 - 100.0 fL   MCH 32.5  26.0 - 34.0 pg   MCHC 34.2  30.0 - 36.0 g/dL   RDW 21.3  08.6 - 57.8 %   Platelets 203  150 - 400 K/uL   Comment: DELTA CHECK NOTED  COMPREHENSIVE METABOLIC PANEL     Status: Abnormal   Collection Time    08/22/12  3:40 AM      Result Value Range   Sodium 135  135 - 145 mEq/L   Potassium 3.5  3.5 - 5.1 mEq/L   Chloride 102  96 - 112 mEq/L   CO2 23  19 - 32 mEq/L   Glucose, Bld 119 (*) 70 - 99 mg/dL   BUN 3 (*) 6 - 23 mg/dL   Creatinine, Ser 4.69  0.50 - 1.35 mg/dL   Calcium 9.1  8.4 - 62.9 mg/dL   Total Protein 6.0  6.0 - 8.3 g/dL   Albumin 2.9 (*) 3.5 - 5.2 g/dL   AST 28  0 - 37 U/L   ALT 33  0 - 53 U/L   Alkaline Phosphatase 23 (*) 39 - 117 U/L   Total Bilirubin 0.3  0.3 - 1.2 mg/dL   GFR calc non Af Amer >90  >90 mL/min   GFR calc Af Amer >90  >90 mL/min   Comment:            The eGFR has  been calculated     using the CKD EPI equation.     This calculation has not been     validated in all clinical     situations.     eGFR's persistently     <90 mL/min signify     possible Chronic Kidney Disease.    Dg Chest Port 1 View  08/21/2012  *RADIOLOGY REPORT*  Clinical Data: Cough.  Alcohol abuse.  Wheezing.  PORTABLE CHEST - 1 VIEW  Comparison: None.  Findings: No airspace disease or effusion.  Cardiopericardial silhouette and mediastinal contours appear within normal limits. No pneumothorax.  Lung volumes are low.  Basilar atelectasis.  IMPRESSION: Low volume chest.   Original Report Authenticated By: Andreas Newport, M.D.      Assessment/Plan: 42 YO  male with known history of excessive drinking likely suffering from ETOH withdrawal.  Patient has been on Precedex for his agitation over the last few days. At this time would recommend using Benzodiazepines for sedation in addition high dose Thiamine 500 mg TID.  In addition, due to patients period of A-fib, would obtain MRI brain to evaluate for embolic shower (this can be obtained once patient is less agitated).   Assessment and plan discussed with with attending physician and they are in agreement.    Felicie Morn PA-C [facility] [phone]  08/22/2012, 9:01 AM   I have seen and evaluated the patient. I have reviewed the above note and made appropriate changes. 42 yo M with delirium in the setting of EtOH withdrawel. I would advise to use benzodiazepines for agitation control and treat aggressively with thiamine. If possible, would obtain MRI to rule out stroke contributing given his recent bout of afib, however I feel that the likelihood of this is low.   Ritta Slot, MD [facility] [phone]  If 7pm- 7am, please page neurology on call at [phone].

## 2012-08-22 NOTE — Progress Notes (Signed)
eLink Physician-Brief Progress Note Patient Name: Steven Cooke DOB: November 09, 1970 MRN: [id number]  Date of Service  08/22/2012   HPI/Events of Note  Currently on precedex but continues to require significant amount of re-direction.  Remains HD stable.q   eICU Interventions  Plan: One time bolus of precedex Continue precedex gtt   Intervention Category Major Interventions: Delirium, psychosis, severe agitation - evaluation and management  DETERDING,ELIZABETH 08/22/2012, 5:44 AM

## 2012-08-22 NOTE — Progress Notes (Signed)
TELEMETRY: Reviewed telemetry pt in NSR, no recurrent atrial fib: Filed Vitals:   08/22/12 0517 08/22/12 0600 08/22/12 0719 08/22/12 0800  BP: 142/81 146/105 126/79 126/81  Pulse: 138 78 65 63  Temp:      TempSrc:      Resp: 20 23  18   Height:      Weight:      SpO2: 96% 98% 97% 94%    Intake/Output Summary (Last 24 hours) at 08/22/12 0904 Last data filed at 08/22/12 0700  Gross per 24 hour  Intake 1027.74 ml  Output   1150 ml  Net -122.26 ml    SUBJECTIVE Denies chest pain or SOB. Agitated. Trying to get out of bed. Confused.  LABS: Basic Metabolic Panel:  Recent Labs  16/10/96 0335 08/22/12 0340  NA 135 135  K 3.0* 3.5  CL 100 102  CO2 25 23  GLUCOSE 103* 119*  BUN 4* 3*  CREATININE 0.87 0.85  CALCIUM 8.3* 9.1   Liver Function Tests:  Recent Labs  08/21/12 0335 08/22/12 0340  AST 31 28  ALT 35 33  ALKPHOS 20* 23*  BILITOT 0.4 0.3  PROT 5.7* 6.0  ALBUMIN 2.5* 2.9*   CBC:  Recent Labs  08/20/12 0345 08/21/12 0335 08/22/12 0340  WBC 3.0* 2.9* 3.7*  NEUTROABS 1.8  --   --   HGB 10.4* 12.1* 12.4*  HCT 30.8* 34.9* 36.3*  MCV 97.5 95.1 95.3  PLT 96* 143* 203   Cardiac Enzymes:  Recent Labs  08/19/12 1359 08/19/12 1955  TROPONINI 0.66* 0.77*   Radiology/Studies:  Dg Chest Port 1 View  08/21/2012  *RADIOLOGY REPORT*  Clinical Data: Cough.  Alcohol abuse.  Wheezing.  PORTABLE CHEST - 1 VIEW  Comparison: None.  Findings: No airspace disease or effusion.  Cardiopericardial silhouette and mediastinal contours appear within normal limits. No pneumothorax.  Lung volumes are low.  Basilar atelectasis.  IMPRESSION: Low volume chest.   Original Report Authenticated By: Andreas Newport, M.D.    ECG: 08/20/12 NSR, normal.  Transthoracic Echocardiography  Patient: Steven Cooke, [place] MR #: [id number] Study Date: 08/21/2012 Gender: M Age: 42 Height: 172.7cm Weight: 98.4kg BSA: 2.60m^2 Pt. Status: Room: 1236  PERFORMING [place],  [facility] Caleb Popp ADMITTING Lovell Sheehan, Harvette C SONOGRAPHER Cathie Beams cc:  ------------------------------------------------------------ LV EF: 55% - 65%  ------------------------------------------------------------ Indications: Atrial fibrillation - 427.31.  ------------------------------------------------------------ History: PMH: Assess wall motion. Assess valve disease. ETOH abuse. ETOH withdrawal. Hyperglycemia. Nausea. Vomiting. Acute encephalopathy. Elevated Troponin.  ------------------------------------------------------------ Study Conclusions  Left ventricle: The cavity size was normal. Systolic function was normal. The estimated ejection fraction was in the range of 55% to 65%. Wall motion was normal; there were no regional wall motion abnormalities. Transthoracic echocardiography. M-mode, complete 2D, spectral Doppler, and color Doppler. Height: Height: 172.7cm. Height: 68in. Weight: Weight: 98.4kg. Weight: 216.5lb. Body mass index: BMI: 33kg/m^2. Body surface area: BSA: 2.52m^2. Blood pressure: 132/83. Patient status: Inpatient. Location: ICU/CCU  ------------------------------------------------------------  ------------------------------------------------------------ Left ventricle: The cavity size was normal. Systolic function was normal. The estimated ejection fraction was in the range of 55% to 65%. Wall motion was normal; there were no regional wall motion abnormalities.  ------------------------------------------------------------ Aortic valve: Trileaflet; normal thickness leaflets. Mobility was not restricted. Doppler: Transvalvular velocity was within the normal range. There was no stenosis. No regurgitation.  ------------------------------------------------------------ Aorta: Aortic root: The aortic root was normal in size.  ------------------------------------------------------------ Mitral valve: Structurally normal  valve. Mobility was not restricted. Doppler: Transvalvular velocity was within the  normal range. There was no evidence for stenosis. Trivial regurgitation. Peak gradient: 5mm Hg (D).  ------------------------------------------------------------ Left atrium: The atrium was normal in size.  ------------------------------------------------------------ Right ventricle: The cavity size was normal. Wall thickness was normal. Systolic function was normal.  ------------------------------------------------------------ Pulmonic valve: Doppler: Transvalvular velocity was within the normal range. There was no evidence for stenosis.  ------------------------------------------------------------ Tricuspid valve: Structurally normal valve. Doppler: Transvalvular velocity was within the normal range. No regurgitation.  ------------------------------------------------------------ Pulmonary artery: The main pulmonary artery was normal-sized. Systolic pressure was within the normal range.  ------------------------------------------------------------ Right atrium: The atrium was normal in size.  ------------------------------------------------------------ Pericardium: There was no pericardial effusion.  ------------------------------------------------------------ Systemic veins: Inferior vena cava: The vessel was normal in size.  ------------------------------------------------------------  2D measurements Normal Doppler measurements Normal Left ventricle Left ventricle LVID ED, 51 mm 43-52 Ea, lat ann, 12. cm/s ------ chord, tiss DP 3 PLAX E/Ea, lat 9.2 ------ LVID ES, 38 mm 23-38 ann, tiss DP 7 chord, Ea, med ann, 8.7 cm/s ------ PLAX tiss DP 7 FS, chord, 25 % >29 E/Ea, med 13 ------ PLAX ann, tiss DP LVPW, ED 9 mm ------ Mitral valve IVS/LVPW 1 <1.3 Peak E vel 114 cm/s ------ ratio, ED Peak A vel 109 cm/s ------ Ventricular septum Deceleration 257 ms 150-23 IVS, ED 9 mm ------ time  0 Aorta Peak 5 mm ------ Root diam, 31 mm ------ gradient, D Hg ED Peak E/A 1 ------ Left atrium ratio AP dim 31 mm ------ Right ventricle AP dim 1.4 cm/m^2 <2.2 Sa vel, lat 16. cm/s ------ index ann, tiss DP 1  ------------------------------------------------------------ Prepared and Electronically Authenticated by  Charlton Haws 2014-03-24T13:[id number]  PHYSICAL EXAM General: Well developed, obese, agitated Head: Normal Neck: Negative for carotid bruits. JVD not elevated. Lungs: Few wheezes. Heart: RRR S1 S2 without murmurs, rubs, or gallops.  Abdomen: Soft, non-tender, non-distended with normoactive bowel sounds.  Extremities: No clubbing, cyanosis or edema.  Distal pedal pulses are 2+ and equal bilaterally. Neuro: Agitated, confused. Moves all extremities spontaneously.   ASSESSMENT AND PLAN: 1. Atrial fibrillation- secondary to Etoh withdrawal. Now converted to NSR. On Zbeta and cardizem po. Echo Normal. TSH normal. 2. Elevated troponin probably related to acute Etoh withdrawal. Normal LV function by Echo. No further ischemic workup recommended at this time. 3. ETOH withdrawal with encephalopathy. 4. Pancytopenia probably related to Etoh abuse.  Patient is stable from a cardiac standpoint. Will switch diltiazem to long acting formulation. Otherwise no active cardiac recs. I will sign off. Please call if needed.  Principal Problem:   Alcohol withdrawal Active Problems:   Atrial fibrillation with RVR   Hyperglycemia   Hypokalemia   Nausea and vomiting   Alcohol abuse   Encephalopathy acute   Elevated troponin    Signed, Milyn Stapleton [place] MD,FACC 08/22/2012 9:04 AM

## 2012-08-22 NOTE — Progress Notes (Signed)
eLink Physician-Brief Progress Note Patient Name: Steven Cooke DOB: [birth year] MRN: [id number]  Date of Service  08/22/2012   HPI/Events of Note  Per nurse report patient has remained confused all night.  Attempting to get OOB.  Has received ativan repeatedly through the night without effect.  VVS with some base line tachycardia.  Precedex had been d/ced yesterday approx 10  eICU Interventions  Plan: Restart precedex   Intervention Category Major Interventions: Delirium, psychosis, severe agitation - evaluation and management  Landry Kamath 08/22/2012, 5:05 AM

## 2012-08-22 NOTE — Progress Notes (Signed)
PULMONARY  / CRITICAL CARE MEDICINE  Name: Steven Cooke MRN: [id number] DOB: 1971-01-15    ADMISSION DATE:  08/18/2012 CONSULTATION DATE:  08/22/2012   REFERRING MD :  TRH  CHIEF COMPLAINT:  Acute alcohol withdrawal  BRIEF PATIENT DESCRIPTION: 42 year old white male with history of alcohol abuse and heavy drinking over the past 3 months presents now with increasing hallucinations and agitation x 5 days. He had increased nausea vomiting 5 days ago. Patient then drank a large amount of liquor 2 days prior to admission. And then one day prior to admission was still drinking.  The pt complained  of palpitations and found to have atrial fibrillation with rapid ventricular response. Now on IV Cardizem bolus IV Cardizem drip and also given IV Ativan. The patient dud  not respond to this treatment and had to be placed on Precedex over night of 3/21. Critical care is now consult.  SIGNIFICANT EVENTS / STUDIES:  Echo 3/24: ef 55-65%  LINES / TUBES: PIV  CULTURES: none  ANTIBIOTICS: None  SUBJECTIVE:  Severely agitated this am required security.  Neuro feels benzos are a better choice, unable to get MRI. No focal deficits.    VITAL SIGNS: Temp:  [98.3 F (36.8 C)-99 F (37.2 C)] 98.8 F (37.1 C) (03/25 0000) Pulse Rate:  [25-138] 63 (03/25 0800) Resp:  [18-30] 18 (03/25 0800) BP: (124-169)/(73-105) 126/81 mmHg (03/25 0800) SpO2:  [86 %-100 %] 94 % (03/25 0800) HEMODYNAMICS: Very agitated. Had to give him IM haldol and valium.     On RA sats OK    INTAKE / OUTPUT: Intake/Output     03/24 0701 - 03/25 0700 03/25 0701 - 03/26 0700   P.O. 360 60   I.V. (mL/kg) 1137.1 (11.6)    Total Intake(mL/kg) 1497.1 (15.2) 60 (0.6)   Urine (mL/kg/hr) 1150 (0.5) 325 (1.5)   Total Output 1150 325   Net +347.1 -265        Urine Occurrence 3 x    Stool Occurrence 2 x      PHYSICAL EXAMINATION: General:  Mildly obese heavyset white male in no acute distress Neuro:  Hyper-agitated  needed 4 strong attendants to hold pt down HEENT:  No jugular venous distention no lymphadenopathy no thyromegaly dry mucous membranes short neck Cardiovascular:  Regular rate and rhythm without S3 normal S1-S2 no murmur rub heave or gallop Lungs:  Clear after cough.  Abdomen:  Soft nontender bowel sounds hypoactive Musculoskeletal:  Full range of motion no joint deformity Skin:  Clear  LABS:  Recent Labs Lab 08/20/12 0345 08/21/12 0335 08/22/12 0340  NA 134* 135 135  K 3.5 3.0* 3.5  CL 102 100 102  CO2 23 25 23   BUN 10 4* 3*  CREATININE 0.89 0.87 0.85  GLUCOSE 105* 103* 119*    Recent Labs Lab 08/20/12 0345 08/21/12 0335 08/22/12 0340  HGB 10.4* 12.1* 12.4*  HCT 30.8* 34.9* 36.3*  WBC 3.0* 2.9* 3.7*  PLT 96* 143* 203    Recent Labs Lab 08/20/12 2212 08/21/12 0830 08/21/12 1216 08/21/12 1751 08/21/12 2233  GLUCAP 115* 136* 120* 152* 135*    CXR: no film  ASSESSMENT / PLAN: Principal Problem:   Alcohol withdrawal Active Problems:   Atrial fibrillation with RVR   Hyperglycemia   Hypokalemia   Nausea and vomiting   Alcohol abuse   Encephalopathy acute   Elevated troponin   PULMONARY A: Mild hypoxemia do to mild hypoventilation P:   Titrate oxygen to off  CARDIOVASCULAR  A: Atrial fibrillation with rapid ventricular response now converted to normal sinus rhythm >nml EF Hypertension P:  Had to hold beta blocker and cardiazem pO d/t delirium Will give IV beta blockade and prn IV cardiazem  RENAL A:  Mild hyponatremia do to alcoholism: better        Hypokalemia  P:   Monitor and replace  Electrolytes as needed Continue saline IV fluids  GASTROINTESTINAL A:  History of emesis and mild volume depletion P:   PO proton pump inhibitor Antiemetics  HEMATOLOGIC A:  No acute hematologic issues P:  Monitor  INFECTIOUS A:  No acute infectious disease issue P:   Monitor  ENDOCRINE A:  No endocrine issue   P:   Monitor  NEUROLOGIC A:    acute alcohol withdrawal with associated encephalopathy.  Had tried to wean him off precedex. Unfortunately he was very agitated and impulsive. Required several haldol doses and precedex was resumed. Neurology questioning possible embolic injury but no overt exam findings, unable to do MRI P:   Will cont precedex>>wean to off depending on response to ativan Neuro suggests transition to higher dose IV ativan and d/c precedex Have asked for both psych and neuro to eval Neuro to assess for additional reasons for delirium, Psych to assess for underlying psychiatric disorder that may be  Contributing to his agitation  May need intubation to get MRI unless improves later  TODAY'S SUMMARY: 42 year old white male with acute alcohol withdrawal. Con IV thiamine at higher dose  and folic acid. We'll continue beta blockade IV,  Cannot give PO meds,  Try IV ativan and get off precedex. May yet need vent.  I have personally obtained a history, examined the patient, evaluated laboratory and imaging results, formulated the assessment and plan and placed orders.  CRITICAL CARE: The patient is critically ill with multiple organ systems failure and requires high complexity decision making for assessment and support, frequent evaluation and titration of therapies, application of advanced monitoring technologies and extensive interpretation of multiple databases. Critical Care Time devoted to patient care services described in this note is 45  minutes.      Dorcas Carrow Beeper  [phone]  Cell  [phone]  If no response or cell goes to voicemail, call beeper [phone]  08/22/2012, 9:14 AM

## 2012-08-23 LAB — COMPREHENSIVE METABOLIC PANEL
Alkaline Phosphatase: 21 U/L — ABNORMAL LOW (ref 39–117)
BUN: 3 mg/dL — ABNORMAL LOW (ref 6–23)
Calcium: 8.9 mg/dL (ref 8.4–10.5)
GFR calc Af Amer: >90 mL/min (ref >90)
GFR calc non Af Amer: >90 mL/min (ref >90)
Glucose, Bld: 109 mg/dL — ABNORMAL HIGH (ref 70–99)
Potassium: 3.6 mEq/L (ref 3.5–5.1)
Total Protein: 6.1 g/dL (ref 6.0–8.3)

## 2012-08-23 LAB — GLUCOSE, CAPILLARY
Glucose-Capillary: 116 mg/dL — ABNORMAL HIGH (ref 70–99)
Glucose-Capillary: 94 mg/dL (ref 70–99)
Glucose-Capillary: 140 mg/dL — ABNORMAL HIGH (ref 70–99)

## 2012-08-23 LAB — CBC
HCT: 38.7 % — ABNORMAL LOW (ref 39.0–52.0)
Hemoglobin: 13.1 g/dL (ref 13.0–17.0)
MCH: 32.2 pg (ref 26.0–34.0)
MCHC: 33.9 g/dL (ref 30.0–36.0)

## 2012-08-23 MED ORDER — HYDRALAZINE HCL 20 MG/ML IJ SOLN
10.0000 mg | INTRAMUSCULAR | Status: DC | PRN
  Administered 2012-08-23: 10 mg via INTRAVENOUS
  Filled 2012-08-23: qty 1

## 2012-08-23 MED ORDER — BENZTROPINE MESYLATE 1 MG/ML IJ SOLN
1.0000 mg | Freq: Four times a day (QID) | INTRAMUSCULAR | Status: DC | PRN
  Filled 2012-08-23: qty 1

## 2012-08-23 MED ORDER — PANTOPRAZOLE SODIUM 40 MG PO TBEC
40.0000 mg | DELAYED_RELEASE_TABLET | Freq: Every day | ORAL | Status: DC
  Administered 2012-08-23 – 2012-08-25 (×3): 40 mg via ORAL
  Filled 2012-08-23 (×3): qty 1

## 2012-08-23 MED ORDER — DILTIAZEM HCL ER COATED BEADS 240 MG PO CP24
240.0000 mg | ORAL_CAPSULE | Freq: Every day | ORAL | Status: DC
  Administered 2012-08-23 – 2012-08-24 (×2): 240 mg via ORAL
  Filled 2012-08-23 (×3): qty 1

## 2012-08-23 MED ORDER — BISOPROLOL FUMARATE 10 MG PO TABS
10.0000 mg | ORAL_TABLET | Freq: Every day | ORAL | Status: DC
  Administered 2012-08-23 – 2012-08-24 (×2): 10 mg via ORAL
  Filled 2012-08-23 (×3): qty 1

## 2012-08-23 MED ORDER — DEXMEDETOMIDINE HCL IN NACL 400 MCG/100ML IV SOLN
0.4000 ug/kg/h | INTRAVENOUS | Status: DC
  Administered 2012-08-23: 0.4 ug/kg/h via INTRAVENOUS
  Filled 2012-08-23 (×2): qty 100

## 2012-08-23 MED ORDER — HALOPERIDOL LACTATE 5 MG/ML IJ SOLN: 5.0000 mg | Freq: Four times a day (QID) | INTRAMUSCULAR | Status: DC | PRN

## 2012-08-23 NOTE — Plan of Care (Signed)
Problem: Consults Goal: General Medical Patient Education See Patient Education Module for specific education.  Outcome: Not Progressing Pt confused and withdrawing not progressing at this time will continue to assess.

## 2012-08-23 NOTE — Progress Notes (Signed)
PULMONARY  / CRITICAL CARE MEDICINE  Name: Steven Cooke MRN: [id number] DOB: [birth year]    ADMISSION DATE:  08/18/2012 CONSULTATION DATE:  08/23/2012   REFERRING MD :  TRH  CHIEF COMPLAINT:  Acute alcohol withdrawal  BRIEF PATIENT DESCRIPTION: 42 year old white male with history of alcohol abuse and heavy drinking over the past 3 months presents now with increasing hallucinations and agitation x 5 days. He had increased nausea vomiting 5 days ago. Patient then drank a large amount of liquor 2 days prior to admission. And then one day prior to admission was still drinking.  The pt complained  of palpitations and found to have atrial fibrillation with rapid ventricular response. Now on IV Cardizem bolus IV Cardizem drip and also given IV Ativan. The patient dud  not respond to this treatment and had to be placed on Precedex over night of 3/21. Critical care is now consult.  SIGNIFICANT EVENTS / STUDIES:  Echo 3/24: ef 55-65%  LINES / TUBES: PIV  CULTURES: none  ANTIBIOTICS: None  SUBJECTIVE:  Less agitated , still on precedex at 0.22mcg,  Labs all ok.  Getting ativan on schedule.  Haldol prn    VITAL SIGNS: Temp:  [97.5 F (36.4 C)-98.1 F (36.7 C)] 97.7 F (36.5 C) (03/26 0800) Pulse Rate:  [60-77] 65 (03/26 0900) Resp:  [14-21] 17 (03/26 0900) BP: (116-178)/(76-131) 157/93 mmHg (03/26 0900) SpO2:  [96 %-100 %] 97 % (03/26 0900) Weight:  [95.3 kg (210 lb 1.6 oz)] 95.3 kg (210 lb 1.6 oz) (03/26 0100) HEMODYNAMICS: Mildly HTN, getting prn hydralazine     On RA sats OK    INTAKE / OUTPUT: Intake/Output     03/25 0701 - 03/26 0700 03/26 0701 - 03/27 0700   P.O. 300    I.V. (mL/kg) 1887 (19.8) 149.2 (1.6)   IV Piggyback 50    Total Intake(mL/kg) 2237 (23.5) 149.2 (1.6)   Urine (mL/kg/hr) 3075 (1.3) 350 (1.5)   Total Output 3075 350   Net -838 -200.8        Urine Occurrence 1 x      PHYSICAL EXAMINATION: General:  Mildly obese heavyset white male in no acute  distress Neuro: somnolent this am, will awake to command and awakens wanting restraints off. HEENT:  No jugular venous distention no lymphadenopathy no thyromegaly dry mucous membranes short neck Cardiovascular:  Regular rate and rhythm without S3 normal S1-S2 no murmur rub heave or gallop Lungs:  Clear after cough.  Abdomen:  Soft nontender bowel sounds hypoactive Musculoskeletal:  Full range of motion no joint deformity Skin:  Clear  LABS:  Recent Labs Lab 08/21/12 0335 08/22/12 0340 08/23/12 0505  NA 135 135 136  K 3.0* 3.5 3.6  CL 100 102 101  CO2 25 23 24   BUN 4* 3* 3*  CREATININE 0.87 0.85 0.88  GLUCOSE 103* 119* 109*    Recent Labs Lab 08/21/12 0335 08/22/12 0340 08/23/12 0505  HGB 12.1* 12.4* 13.1  HCT 34.9* 36.3* 38.7*  WBC 2.9* 3.7* 3.9*  PLT 143* 203 226    Recent Labs Lab 08/22/12 0819 08/22/12 1219 08/22/12 1543 08/22/12 2147 08/23/12 0803  GLUCAP 122* 130* 135* 112* 116*    CXR: no film  ASSESSMENT / PLAN: Principal Problem:   Alcohol withdrawal Active Problems:   Atrial fibrillation with RVR   Hyperglycemia   Hypokalemia   Nausea and vomiting   Alcohol abuse   Encephalopathy acute   Elevated troponin   PULMONARY A: Mild hypoxemia do  to mild hypoventilation P:   Watch sats off oxygen  CARDIOVASCULAR A: Atrial fibrillation with rapid ventricular response now converted to normal sinus rhythm >nml EF Hypertension P:  Give po beta blocker and cardiazem today  RENAL A:  Mild hyponatremia do to alcoholism: better        Hypokalemia  P:   Monitor and replace  Electrolytes as needed Continue saline IV fluids  GASTROINTESTINAL A:  History of emesis and mild volume depletion P:   PO proton pump inhibitor Antiemetics  HEMATOLOGIC A:  No acute hematologic issues P:  Monitor  INFECTIOUS A:  No acute infectious disease issue P:   Monitor  ENDOCRINE A:  No endocrine issue   P:   Monitor  NEUROLOGIC A:   acute alcohol  withdrawal with associated encephalopathy.  Had tried to wean him off precedex.  P:   Will cont precedex>>try to wean off with ativan on schedule and prn haldo I apprec both psych and neuro evals Hold MRI until better, if worsens will intubate,sedate and get MRI  TODAY'S SUMMARY: 42 year old white male with acute alcohol withdrawal. Con IV thiamine at higher dose  and folic acid. Try to wean precedex off again 3/26 and try po meds and diet and get off restraints again.  I have personally obtained a history, examined the patient, evaluated laboratory and imaging results, formulated the assessment and plan and placed orders.  CRITICAL CARE: The patient is critically ill with multiple organ systems failure and requires high complexity decision making for assessment and support, frequent evaluation and titration of therapies, application of advanced monitoring technologies and extensive interpretation of multiple databases. Critical Care Time devoted to patient care services described in this note is 45  minutes.      Dorcas Carrow Beeper  [phone]  Cell  [phone]  If no response or cell goes to voicemail, call beeper [phone]  08/23/2012, 9:29 AM

## 2012-08-23 NOTE — Progress Notes (Signed)
eLink Physician-Brief Progress Note Patient Name: Steven Cooke DOB: 05-Jan-1971 MRN: [id number]  Date of Service  08/23/2012   HPI/Events of Note  Hypertension with HR in the 60s  eICU Interventions  PRN order for hydralazine 10 mg IV q2 hours prn systolic BP greater than 160 mmHg   Intervention Category Intermediate Interventions: Hypertension - evaluation and management  DETERDING,ELIZABETH 08/23/2012, 5:08 AM

## 2012-08-23 NOTE — Progress Notes (Signed)
Clinical Social Work  CSW received psych consult to complete psychosocial assessment. CSW spoke with RN who reports patient remains confused with slurred speech. No family at bedside. CSW will follow up at later time to complete full assessment.  [place], [place] [phone]

## 2012-08-24 ENCOUNTER — Inpatient Hospital Stay (HOSPITAL_COMMUNITY): Payer: BC Managed Care – PPO

## 2012-08-24 LAB — BASIC METABOLIC PANEL
CO2: 24 mEq/L (ref 19–32)
Chloride: 99 mEq/L (ref 96–112)
Creatinine, Ser: 0.99 mg/dL (ref 0.50–1.35)
Glucose, Bld: 93 mg/dL (ref 70–99)

## 2012-08-24 LAB — CBC
HCT: 38.4 % — ABNORMAL LOW (ref 39.0–52.0)
MCH: 31.8 pg (ref 26.0–34.0)
MCV: 94.6 fL (ref 78.0–100.0)
RDW: 12.5 % (ref 11.5–15.5)
WBC: 4.8 10*3/uL (ref 4.0–10.5)

## 2012-08-24 LAB — GLUCOSE, CAPILLARY
Glucose-Capillary: 116 mg/dL — ABNORMAL HIGH (ref 70–99)
Glucose-Capillary: 120 mg/dL — ABNORMAL HIGH (ref 70–99)

## 2012-08-24 MED ORDER — LORAZEPAM 1 MG PO TABS
2.0000 mg | ORAL_TABLET | ORAL | Status: DC
  Administered 2012-08-24 – 2012-08-25 (×6): 2 mg via ORAL
  Filled 2012-08-24 (×6): qty 2

## 2012-08-24 MED ORDER — FOLIC ACID 1 MG PO TABS
1.0000 mg | ORAL_TABLET | Freq: Every day | ORAL | Status: DC
  Administered 2012-08-24 – 2012-08-25 (×2): 1 mg via ORAL
  Filled 2012-08-24 (×2): qty 1

## 2012-08-24 MED ORDER — VITAMIN B-1 100 MG PO TABS
500.0000 mg | ORAL_TABLET | Freq: Three times a day (TID) | ORAL | Status: DC
  Administered 2012-08-24 – 2012-08-25 (×4): 500 mg via ORAL
  Filled 2012-08-24 (×11): qty 5

## 2012-08-24 NOTE — Progress Notes (Signed)
Clinical Social Work Department CLINICAL SOCIAL WORK PSYCHIATRY SERVICE LINE ASSESSMENT 08/24/2012  Patient:  Steven Cooke  Account:  [id number]  Admit Date:  08/18/2012  Clinical Social Worker:  Unk Lightning, LCSW  Date/Time:  08/24/2012 03:15 PM Referred by:  Physician  Date referred:  08/24/2012 Reason for Referral  Substance Abuse   Presenting Symptoms/Problems (In the person's/family's own words):   Psych consulted to assist with substance abuse resources   Abuse/Neglect/Trauma History (check all that apply)  Denies history   Abuse/Neglect/Trauma Comments:   Psychiatric History (check all that apply)  Denies history   Psychiatric medications:  Ativan 2 mg   Current Mental Health Hospitalizations/Previous Mental Health History:   Patient denies any MH diagnosis. Wife feels that patient has been depressed/anxious for the past 2-5 years. Patient has never received any treatment.   Current provider:   None   Place and Date:   None   Current Medications:   acetaminophen, acetaminophen, diphenhydrAMINE, HYDROmorphone (DILAUDID) injection, LORazepam, ondansetron (ZOFRAN) IV, ondansetron            . bisoprolol  10 mg Oral Daily  . diltiazem  240 mg Oral Daily  . enoxaparin (LOVENOX) injection  40 mg Subcutaneous Q2000  . folic acid  1 mg Oral Daily  . LORazepam  2 mg Oral Q4H  . pantoprazole  40 mg Oral Daily  . thiamine  500 mg Oral TID   Previous Impatient Admission/Date/Reason:   None reported   Emotional Health / Current Symptoms    Suicide/Self Harm  None reported   Suicide attempt in the past:   Patient denies any SI or HI. Patient denies any suicide attempts in the past.   Other harmful behavior:   None reported   Psychotic/Dissociative Symptoms  None reported   Other Psychotic/Dissociative Symptoms:   Patient reports he experienced hallucinations at home and when detoxing but reports no current psychotic features.    Attention/Behavioral  Symptoms  Restless   Other Attention / Behavioral Symptoms:   Patient unable to remain still during assessment. Patient pacing in room during session.    Cognitive Impairment  Within Normal Limits   Other Cognitive Impairment:    Mood and Adjustment  Flat    Stress, Anxiety, Trauma, Any Recent Loss/Stressor  Grief/Loss (recent or history)   Anxiety (frequency):   Phobia (specify):   Compulsive behavior (specify):   Obsessive behavior (specify):   Other:   Patient reports he never "got over my dad dying 5 years ago." Recent stress when sister divorced about 2 years ago.   Substance Abuse/Use  Current substance use  Substance abuse treatment needed   SBIRT completed (please refer for detailed history):  Y  Self-reported substance use:   Patient reports he has drank beer since he was a teenager. Patient reports that he begun drinking beer and liquor about 2 years ago. Patient reports that drinking became a habit and something he "had to have to function". Patient reports that consumption varies but that on most days he can drink a pint of liquor and beer throughout the day.   Urinary Drug Screen Completed:  Y Alcohol level:   <11    Environmental/Housing/Living Arrangement  Stable housing   Who is in the home:   Wife and 3 kids   Emergency contact:  Community education officer   Patient's Strengths and Goals (patient's own words):   Patient motivated to stay sober and to attend treatment.   Clinical  Social Worker's Interpretive Summary:   CSW received referral to assist with substance abuse resources. CSW reviewed chart and met with patient and wife at bedside. Patient agreeable to wife involvement throughout assessment. CSW introduced myself and explained role.    Patient reports that he attempted to detox at home. Patient reports he was hallucinating and shaking. Patient experienced episodes of vomiting and reports he had an unsteady gait.  Wife encouraged patient to come to the hospital and patient agreed.    Patient has been drinking for several years but began drinking heavily about two years ago. Patient reports that he never coped with death of father and then other family stressors led him to turn to alcohol to self medicate. Patient recognizes the harm of alcohol and is motivated for treatment.    Patient lives with wife and three children. 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 Patient agreeable to talk about different treatment options. CSW explained outpatient therapy, intensive outpatient therapy and inpatient treatment. Patient immediately stated he could not do inpatient treatment due to the fact that he needs to work and be at home with his family. Patient agreeable to intensive outpatient.    CSW left room and researched options available in [place]. Only once facility has Substance Abuse Intensive Outpatient Program [facility]) that accepts his insurance. CSW provided patient with printed information for [facility] with description of program. CSW explained walk-in hours and spoke with agency who reports they have available openings at this time. Patient reports that when he discharges he will go to agency. Wife involved with plan and agreeable to support patient and keep him accountable.    Patient engaged throughout assessment but restless at times. Patient struggles with sitting still and paced during majority of assessment. Patient open to discussing substance use and was able to reflect on triggers to drinking. With proper treatment, patient can learn positive coping skills and manage grief from father's death.    CSW will continue to follow to support patient throughout hospitalization. All information provided to patient is also placed on AVS.   Disposition:  Outpatient referral made/needed

## 2012-08-24 NOTE — Progress Notes (Signed)
PULMONARY  / CRITICAL CARE MEDICINE  Name: Steven Cooke MRN: [id number] DOB: 01-05-1971    ADMISSION DATE:  08/18/2012 CONSULTATION DATE:  08/24/2012   REFERRING MD :  TRH  CHIEF COMPLAINT:  Acute alcohol withdrawal  BRIEF PATIENT DESCRIPTION: 42 year old white male with history of alcohol abuse and heavy drinking over the past 3 months presents now with increasing hallucinations and agitation x 5 days. He had increased nausea vomiting 5 days ago. Patient then drank a large amount of liquor 2 days prior to admission. And then one day prior to admission was still drinking.  The pt complained  of palpitations and found to have atrial fibrillation with rapid ventricular response. Now on IV Cardizem bolus IV Cardizem drip and also given IV Ativan. The patient dud  not respond to this treatment and had to be placed on Precedex over night of 3/21. Critical care is now consult.  SIGNIFICANT EVENTS / STUDIES:  Echo 3/24: ef 55-65%  LINES / TUBES: PIV  CULTURES: none  ANTIBIOTICS: None  SUBJECTIVE:  Markedly better. Now off precedex since 8AM 3/27  VITAL SIGNS: Temp:  [97.4 F (36.3 C)-98.6 F (37 C)] 98.2 F (36.8 C) (03/27 0800) Pulse Rate:  [65-95] 82 (03/27 0800) Resp:  [14-21] 14 (03/27 0800) BP: (101-129)/(59-90) 121/67 mmHg (03/27 0800) SpO2:  [92 %-99 %] 96 % (03/27 0800) HEMODYNAMICS: stable    On RA sats OK    INTAKE / OUTPUT: Intake/Output     03/26 0701 - 03/27 0700 03/27 0701 - 03/28 0700   P.O. 220    I.V. (mL/kg) 1418.1 (14.9) 100 (1)   IV Piggyback     Total Intake(mL/kg) 1638.1 (17.2) 100 (1)   Urine (mL/kg/hr) 2425 (1.1) 250 (1.1)   Total Output 2425 250   Net -786.9 -150        Stool Occurrence 2 x      PHYSICAL EXAMINATION: General:  Mildly obese heavyset white male in no acute distress Neuro:will awaken to command, ate breakfast, more appropriate HEENT:  No jugular venous distention no lymphadenopathy no thyromegaly dry mucous membranes  short neck Cardiovascular:  Regular rate and rhythm without S3 normal S1-S2 no murmur rub heave or gallop Lungs:  Clear after cough.  Abdomen:  Soft nontender bowel sounds hypoactive Musculoskeletal:  Full range of motion no joint deformity Skin:  Clear  LABS:  Recent Labs Lab 08/22/12 0340 08/23/12 0505 08/24/12 0340  NA 135 136 133*  K 3.5 3.6 3.7  CL 102 101 99  CO2 23 24 24   BUN 3* 3* 7  CREATININE 0.85 0.88 0.99  GLUCOSE 119* 109* 93    Recent Labs Lab 08/22/12 0340 08/23/12 0505 08/24/12 0340  HGB 12.4* 13.1 12.9*  HCT 36.3* 38.7* 38.4*  WBC 3.7* 3.9* 4.8  PLT 203 226 273    Recent Labs Lab 08/23/12 1146 08/23/12 1541 08/23/12 1950 08/23/12 2348 08/24/12 0847  GLUCAP 94 98 140* 120* 116*    CXR: 3/27: clear  ASSESSMENT / PLAN: Principal Problem:   Alcohol withdrawal Active Problems:   Atrial fibrillation with RVR   Hyperglycemia   Hypokalemia   Nausea and vomiting   Alcohol abuse   Encephalopathy acute   Elevated troponin   PULMONARY A: no active issues P:   Watch sats off oxygen  CARDIOVASCULAR A: Atrial fibrillation with rapid ventricular response now converted to normal sinus rhythm >nml EF Hypertension P:  Cont  po beta blocker and cardiazem  trf to tele bed  RENAL A:  Mild hyponatremia do to alcoholism: better        Hypokalemia  P:   Monitor and replace  Electrolytes as needed Continue saline IV fluids  GASTROINTESTINAL A:  History of emesis and mild volume depletion>>>resolved P:   PO proton pump inhibitor   HEMATOLOGIC A:  No acute hematologic issues P:  Monitor  INFECTIOUS A:  No acute infectious disease issue P:   Monitor  ENDOCRINE A:  No endocrine issue   P:   Monitor  NEUROLOGIC A:   acute alcohol withdrawal with associated encephalopathy. Much better 3/27 and off precedex  P:   Psych f/u Change ativan to 2mg  q4h po D/c precedex No need for sitter  TODAY'S SUMMARY: 42 year old white male  with acute alcohol withdrawal. Now improved 3/27 Plan to change to po meds and tfr to floor /tele bed. Needs psych detox f/u  I have personally obtained a history, examined the patient, evaluated laboratory and imaging results, formulated the assessment and plan and placed orders.  I Will ask TRH to reassume care 3/28  Dorcas Carrow Beeper  [phone]  Cell  [phone]  If no response or cell goes to voicemail, call beeper [phone]  08/24/2012, 9:28 AM

## 2012-08-24 NOTE — Clinical Social Work Note (Signed)
ICU CSW signing off currently as Psych CSW will follow up for detox plan and treatment.  See note dated 3/25 if needed.   Doreen Salvage, LCSW ICU/Stepdown Clinical Social Worker [facility] Cell [phone] Hours 8am-1200pm M-F

## 2012-08-25 MED ORDER — METOPROLOL TARTRATE 50 MG PO TABS: 50.0000 mg | ORAL_TABLET | Freq: Two times a day (BID) | ORAL | Status: DC

## 2012-08-25 MED ORDER — ASPIRIN 81 MG PO CHEW
81.0000 mg | CHEWABLE_TABLET | Freq: Every day | ORAL | Status: DC
  Administered 2012-08-25: 81 mg via ORAL
  Filled 2012-08-25: qty 1

## 2012-08-25 MED ORDER — METOPROLOL TARTRATE 50 MG PO TABS
50.0000 mg | ORAL_TABLET | Freq: Two times a day (BID) | ORAL | Status: DC
  Administered 2012-08-25: 50 mg via ORAL
  Filled 2012-08-25 (×2): qty 1

## 2012-08-25 MED ORDER — CHLORDIAZEPOXIDE HCL 5 MG PO CAPS
10.0000 mg | ORAL_CAPSULE | Freq: Three times a day (TID) | ORAL | Status: DC
  Administered 2012-08-25: 10 mg via ORAL
  Filled 2012-08-25: qty 2

## 2012-08-25 MED ORDER — FOLIC ACID 1 MG PO TABS: 1.0000 mg | ORAL_TABLET | Freq: Every day | ORAL | Status: DC

## 2012-08-25 MED ORDER — THIAMINE HCL 100 MG PO TABS: 100.0000 mg | ORAL_TABLET | Freq: Every day | ORAL | Status: DC

## 2012-08-25 MED ORDER — CHLORDIAZEPOXIDE HCL 5 MG PO CAPS: ORAL_CAPSULE | ORAL | Status: DC

## 2012-08-25 MED ORDER — ASPIRIN 81 MG PO CHEW: 81.0000 mg | CHEWABLE_TABLET | Freq: Every day | ORAL | Status: DC

## 2012-08-25 NOTE — Discharge Summary (Signed)
[facility]                                                                                   Steven Cooke, is a 42 y.o. male  DOB 12-04-1970  MRN [id number].  Admission date:  08/18/2012  Discharge Date:  08/25/2012  Primary MD  No primary provider on file.  Admitting Physician  Ron Parker, MD  Admission Diagnosis  Alcohol withdrawal [291.81] Atrial fibrillation [427.31] Hypokalemia [276.8]  Discharge Diagnosis     Principal Problem:   Alcohol withdrawal Active Problems:   Atrial fibrillation with RVR   Hyperglycemia   Hypokalemia   Nausea and vomiting   Alcohol abuse   Encephalopathy acute   Elevated troponin      Past Medical History  Diagnosis Date  . Alcohol abuse   . Hypertension   . Reflux   . Gout   . Hyperlipidemia   . GERD (gastroesophageal reflux disease)     Past Surgical History  Procedure Laterality Date  . No past surgeries       Recommendations for primary care physician for things to follow:       Discharge Diagnoses:   Principal Problem:   Alcohol withdrawal Active Problems:   Atrial fibrillation with RVR   Hyperglycemia   Hypokalemia   Nausea and vomiting   Alcohol abuse   Encephalopathy acute   Elevated troponin    Discharge Condition: Stable   Diet recommendation: See Discharge Instructions below   Consults Cards,Neuro,PCM    History of present illness and  Hospital Course:     Kindly see H&P for history of present illness and admission details, please review complete Labs, Consult reports and Test reports for all details in brief Steven Cooke, is a 42 y.o. male, patient straight of heavy alcohol abuse was admitted after he developed gastroenteritis and was unable to keep alcohol down putting him in severe DTs, due to his DTs he went into atrial fibrillation it RVR, he did have rate-related demand ischemia with mild rise in troponin, he was initially seen by pulmonary critical  care team, cardiology also saw and evaluated the patient and after looking at his stable echogram which showed no wall motion abnormalities and EF of 55-60% it was decided that no further cardiac workup was needed. He should was also seen by psych and neurology.   I have taken over his care today, he is completely back to his baseline, I have discussed his case with neurologist on call Dr. Cyril Mourning who recommends no further neurological workup including no MRI. It is back to baseline no focal logical deficits, denies any headache chest pain etc., he is willing to abstain from alcohol and is social work along with case management has provided him with outpatient resources for the same.   Will be discharged on Librium taper, folic acid and thiamine, for his atrial fibrillation and hypertension he will be placed on better blocker, he has dyslipidemia and his home medication fenofibrate will be continued unchanged. Low-dose aspirin will also be prescribed as he had mild troponin leak from rate-related demand ischemia.   I have requested him to follow with  primary care physician as suggested by case management in 1 week, he will also follow with Dr. Peter Swaziland cardiologist who saw the patient here in 1-2 weeks for any outpatient workup if needed.      Today   Subjective:   Steven Cooke today has no headache,no chest abdominal pain,no new weakness tingling or numbness, feels much better wants to go home today.   Objective:   Blood pressure 115/84, pulse 82, temperature 98.6 F (37 C), temperature source Oral, resp. rate 16, height 5\' 8"  (1.727 m), weight 95.3 kg (210 lb 1.6 oz), SpO2 98.00%.   Intake/Output Summary (Last 24 hours) at 08/25/12 0939 Last data filed at 08/25/12 0900  Gross per 24 hour  Intake    720 ml  Output    400 ml  Net    320 ml    Exam Awake Alert, Oriented *3, No new F.N deficits, Normal affect [place].AT,PERRAL Supple Neck,No JVD, No cervical lymphadenopathy  appriciated.  Symmetrical Chest wall movement, Good air movement bilaterally, CTAB RRR,No Gallops,Rubs or new Murmurs, No Parasternal Heave +ve B.Sounds, Abd Soft, Non tender, No organomegaly appriciated, No rebound -guarding or rigidity. No Cyanosis, Clubbing or edema, No new Rash or bruise  Data Review   Major procedures and Radiology Reports - PLEASE review detailed and final reports for all details in brief -    Echo  Left ventricle: The cavity size was normal. Systolic function was normal. The estimated ejection fraction was in the range of 55% to 65%. Wall motion was normal; there were no regional wall motion abnormalities     Dg Chest Port 1 View  08/24/2012  *RADIOLOGY REPORT*  Clinical Data: Cough.  PORTABLE CHEST - 1 VIEW  Comparison: Chest 08/21/2012.  Findings: Lungs are clear.  Heart size normal.  No pneumothorax or pleural effusion.  IMPRESSION: No acute disease.   Original Report Authenticated By: Holley Dexter, M.D.    Dg Chest Port 1 View  08/21/2012  *RADIOLOGY REPORT*  Clinical Data: Cough.  Alcohol abuse.  Wheezing.  PORTABLE CHEST - 1 VIEW  Comparison: None.  Findings: No airspace disease or effusion.  Cardiopericardial silhouette and mediastinal contours appear within normal limits. No pneumothorax.  Lung volumes are low.  Basilar atelectasis.  IMPRESSION: Low volume chest.   Original Report Authenticated By: Andreas Newport, M.D.     Micro Results      Recent Results (from the past 240 hour(s))  MRSA PCR SCREENING     Status: None   Collection Time    08/18/12 10:56 PM      Result Value Range Status   MRSA by PCR NEGATIVE  NEGATIVE Final   Comment:            The GeneXpert MRSA Assay (FDA     approved for NASAL specimens     only), is one component of a     comprehensive MRSA colonization     surveillance program. It is not     intended to diagnose MRSA     infection nor to guide or     monitor treatment for     MRSA infections.     CBC w Diff:  Lab Results  Component Value Date   WBC 4.8 08/24/2012   HGB 12.9* 08/24/2012   HCT 38.4* 08/24/2012   PLT 273 08/24/2012   LYMPHOPCT 21 08/20/2012   MONOPCT 18* 08/20/2012   EOSPCT 1 08/20/2012   BASOPCT 1 08/20/2012    CMP: Lab Results  Component Value Date   NA 133* 08/24/2012   K 3.7 08/24/2012   CL 99 08/24/2012   CO2 24 08/24/2012   BUN 7 08/24/2012   CREATININE 0.99 08/24/2012   PROT 6.1 08/23/2012   ALBUMIN 2.9* 08/23/2012   BILITOT 0.5 08/23/2012   ALKPHOS 21* 08/23/2012   AST 41* 08/23/2012   ALT 38 08/23/2012  .   Discharge Instructions     Follow with Primary MD in 7 days   Get CBC, CMP, checked 7 days by Primary MD and again as instructed by your Primary MD.    Get Medicines reviewed and adjusted.  Please request your Prim.MD to go over all Hospital Tests and Procedure/Radiological results at the follow up, please get all Hospital records sent to your Prim MD by signing hospital release before you go home.  Activity: As tolerated with Full fall precautions use walker/cane & assistance as needed   Diet:  Heart Healthy  For Heart failure patients - Check your Weight same time everyday, if you gain over 2 pounds, or you develop in leg swelling, experience more shortness of breath or chest pain, call your Primary MD immediately. Follow Cardiac Low Salt Diet and 1.8 lit/day fluid restriction.  Disposition Home   If you experience worsening of your admission symptoms, develop shortness of breath, life threatening emergency, suicidal or homicidal thoughts you must seek medical attention immediately by calling 911 or calling your MD immediately  if symptoms less severe.  You Must read complete instructions/literature along with all the possible adverse reactions/side effects for all the Medicines you take and that have been prescribed to you. Take any new Medicines after you have completely understood and accpet all the possible adverse reactions/side effects.   Do not drive and  provide baby sitting services if your were admitted for syncope or siezures until you have seen by Primary MD or a Neurologist and advised to do so again.  Do not drive when taking Pain medications.    Do not take more than prescribed Pain, Sleep and Anxiety Medications  Special Instructions: If you have smoked or chewed Tobacco  in the last 2 yrs please stop smoking, stop any regular Alcohol  and or any Recreational drug use.  Wear Seat belts while driving.     Follow-up Information   Follow [facility] with [facility]. (Walk-in clinic Monday and Wednesday 1pm-3pm or Friday 8-10am (Please bring photo ID and insurance card))    Contact information:   [address] [place], [place] [address] Office: [phone]       Follow up with PCP as suggested by Case Manager. Schedule an appointment as soon as possible for a visit in 1 week.      Follow up with Peter Swaziland, MD. Schedule an appointment as soon as possible for a visit in 1 week.   Contact information:   [address] [place] [address] [phone]         Discharge Medications     Medication List    STOP taking these medications       bisoprolol 5 MG tablet  Commonly known as:  ZEBETA     LORazepam 1 MG tablet  Commonly known as:  ATIVAN      TAKE these medications       aspirin 81 MG chewable tablet  Chew 1 tablet (81 mg total) by mouth daily.     chlordiazePOXIDE 5 MG capsule  Commonly known as:  LIBRIUM  Please dispense 18 pills - Take 1 pill three times a day for 3 days, then Take 1 pill two times a day for 3 days, then Take 1 pill once a day for 3 days and stop.     citalopram 20 MG tablet  Commonly known as:  CELEXA  Take 20 mg by mouth daily.     DEXILANT 60 MG capsule  Generic drug:  dexlansoprazole  Take 60 mg by mouth daily.     fenofibrate 160 MG tablet  Take 160 mg by mouth daily.     folic acid 1 MG tablet  Commonly known as:  FOLVITE  Take 1 tablet (1 mg  total) by mouth daily.     metoprolol 50 MG tablet  Commonly known as:  LOPRESSOR  Take 1 tablet (50 mg total) by mouth 2 (two) times daily.     thiamine 100 MG tablet  Take 1 tablet (100 mg total) by mouth daily.           Total Time in preparing paper work, data evaluation and todays exam - 35 minutes  Leroy Sea M.D on 08/25/2012 at 9:39 AM  [facility] Office  [phone]

## 2012-08-25 NOTE — Care Management Note (Signed)
    Page 1 of 1   08/25/2012     10:35:27 AM   CARE MANAGEMENT NOTE 08/25/2012  Patient:  ANGELA, PLATNER   Account Number:  [id number]  Date Initiated:  08/21/2012  Documentation initiated by:  DAVIS,RHONDA  Subjective/Objective Assessment:   patient with hx of etoh abuse and is dts and requiring iv sedation, also a.fib-iv cardizem transitioning to po cardizem.     Action/Plan:   home with family   Anticipated DC Date:  08/25/2012   Anticipated DC Plan:  HOME/SELF CARE  In-house referral  PCP / [facility]      DC Planning Services  CM consult      Choice offered to / List presented to:             Status of service:  Completed, signed off Medicare Important Message given?   (If response is "NO", the following Medicare IM given date fields will be blank) Date Medicare IM given:   Date Additional Medicare IM given:    Discharge Disposition:  HOME/SELF CARE  Per UR Regulation:  Reviewed for med. necessity/level of care/duration of stay  If discussed at Long Length of Stay Meetings, dates discussed:    Comments:  08/25/12 Char Feltman RN,BSN NCM [phone] PCP LIST,& OTHER COMMUNITY RESOURCES GIVEN TO PATIENT.PATIENT VOICED COMPREHENSION.  [id number] Earlene Plater, RN, BSN, CCM:  CHART REVIEWED AND UPDATED.  Next chart review due on [id number]. NO DISCHARGE NEEDS PRESENT AT THIS TIME. CASE MANAGEMENT [phone]

## 2012-09-15 ENCOUNTER — Encounter: Payer: BC Managed Care – PPO | Admitting: Nurse Practitioner

## 2012-09-22 ENCOUNTER — Encounter: Payer: Self-pay | Admitting: Nurse Practitioner

## 2012-09-22 ENCOUNTER — Ambulatory Visit (INDEPENDENT_AMBULATORY_CARE_PROVIDER_SITE_OTHER): Payer: BC Managed Care – PPO | Admitting: Nurse Practitioner

## 2012-09-22 VITALS — BP 122/95 | HR 80 | Ht 66.0 in | Wt 212.0 lb

## 2012-09-22 DIAGNOSIS — I4891 Unspecified atrial fibrillation: Secondary | ICD-10-CM

## 2012-09-22 MED ORDER — METOPROLOL TARTRATE 50 MG PO TABS: 50.0000 mg | ORAL_TABLET | Freq: Two times a day (BID) | ORAL | Status: AC

## 2012-09-22 NOTE — Patient Instructions (Addendum)
I congratulate you for not drinking!  We will see you in 6 months  Monitor your blood pressure at home  Call the [facility] office at [phone] if you have any questions, problems or concerns.

## 2012-09-22 NOTE — Progress Notes (Signed)
Steven Cooke Date of Birth: March 22, 1971 Medical Record #[id number]  History of Present Illness: [name] is seen back today for a post hospital visit. He is seen for Dr. Swaziland. He has a history of alcoholism, HTN, GERD, gout and HLD. Recently developed gastroenteritis and was unable to keep his alcohol down - thus had DT's - this led to atrial fib with RVR. Echo showed an EF of 55 to 60%. No wall motion abnormalities. He was placed on beta blocker therapy.   He comes in today. He is here with his wife. [place] No longer drinking liquor. Has had only one beer at [place]. Not gone to AA yet but has been to his PCP. Feels much better. Walking now every day. No palpitations. Not dizzy or lightheaded. No atrial fib that he is aware of.   Current Outpatient Prescriptions on File Prior to Visit  Medication Sig Dispense Refill  . aspirin 81 MG chewable tablet Chew 1 tablet (81 mg total) by mouth daily.  30 tablet  0  . citalopram (CELEXA) 20 MG tablet Take 20 mg by mouth daily.      Marland Kitchen dexlansoprazole (DEXILANT) 60 MG capsule Take 60 mg by mouth daily.      . fenofibrate 160 MG tablet Take 160 mg by mouth daily.       No current facility-administered medications on file prior to visit.    No Known Allergies  Past Medical History  Diagnosis Date  . Alcohol abuse   . Hypertension   . Reflux   . Gout   . Hyperlipidemia   . GERD (gastroesophageal reflux disease)   . Atrial fibrillation     Past Surgical History  Procedure Laterality Date  . No past surgeries      History  Smoking status  . Never Smoker   Smokeless tobacco  . Never Used    History  Alcohol Use  . Yes    Comment: heavy use    Family History  Problem Relation Age of Onset  . CAD Father     Review of Systems: The review of systems is per the HPI.  All other systems were reviewed and are negative.  Physical Exam: BP 122/95  Pulse 80  Ht 5\' 6"  (1.676 m)  Wt 212  lb (96.163 kg)  BMI 34.23 kg/m2 Patient is very pleasant and in no acute distress. He is obese. Skin is warm and dry. Color is normal.  HEENT is unremarkable. Normocephalic/atraumatic. PERRL. Sclera are nonicteric. Neck is supple. No masses. No JVD. Lungs are clear. Cardiac exam shows a regular rate and rhythm. Abdomen is soft. Extremities are without edema. Gait and ROM are intact. No gross neurologic deficits noted.  LABORATORY DATA: EKG shows sinus rhythm today.   Lab Results  Component Value Date   WBC 4.8 08/24/2012   HGB 12.9* 08/24/2012   HCT 38.4* 08/24/2012   PLT 273 08/24/2012   GLUCOSE 93 08/24/2012   ALT 38 08/23/2012   AST 41* 08/23/2012   NA 133* 08/24/2012   K 3.7 08/24/2012   CL 99 08/24/2012   CREATININE 0.99 08/24/2012   BUN 7 08/24/2012   CO2 24 08/24/2012   TSH 0.903 08/21/2012   HGBA1C 6.5* 08/19/2012   Echo Study Conclusions  Left ventricle: The cavity size was normal. Systolic function was normal. The estimated ejection fraction was in the range of 55% to 65%. Wall motion was normal; there were  no regional wall motion abnormalities.   Assessment / Plan: 1. Atrial fib with RVR - due to alcohol withdrawal - remains in sinus rhythm. Echo was normal. TSH was normal. No further evaluation was felt to be needed.   2. Alcoholism with recent DT's - he says he can abstain.   3. HTN - fair control. I have asked him to monitor at home.   We will tentatively see him back in 6 months. I have refilled his Metoprolol today. Encouraged him to go to AA.   Patient is agreeable to this plan and will call if any problems develop in the interim.   Rosalio Macadamia, RN, ANP-C [facility] [address] [place], [place]  [address]

## 2012-09-22 NOTE — Addendum Note (Signed)
**Note De-Identified Jenea Dake Obfuscation** Addended by: Demetrios Loll on: 09/22/2012 03:01 PM   Modules accepted: Orders

## 2015-05-07 ENCOUNTER — Ambulatory Visit: Payer: Self-pay | Admitting: Orthopedic Surgery

## 2015-05-13 ENCOUNTER — Ambulatory Visit: Payer: Self-pay | Admitting: Orthopedic Surgery

## 2015-05-13 NOTE — H&P (Signed)
TOTAL HIP ADMISSION H&P  Patient is admitted for right total hip arthroplasty.  Subjective:  Chief Complaint: right hip pain  HPI: Steven Cooke, 44 y.o. male, has a history of pain and functional disability in the right hip(s) due to AVN and patient has failed non-surgical conservative treatments for greater than 12 weeks to include NSAID's and/or analgesics, flexibility and strengthening excercises, use of assistive devices, weight reduction as appropriate and activity modification.  Onset of symptoms was abrupt starting 1 years ago with rapidlly worsening course since that time.The patient noted no past surgery on the right hip(s).  Patient currently rates pain in the right hip at 10 out of 10 with activity. Patient has night pain, worsening of pain with activity and weight bearing, pain that interfers with activities of daily living and pain with passive range of motion. Patient has evidence of AVN with collapse by imaging studies. This condition presents safety issues increasing the risk of falls.  There is no current active infection.  Patient Active Problem List   Diagnosis Date Noted  . Elevated troponin 08/20/2012  . Encephalopathy acute 08/19/2012  . Alcohol withdrawal (HCC) 08/18/2012  . Atrial fibrillation with RVR (HCC) 08/18/2012  . Hyperglycemia 08/18/2012  . Hypokalemia 08/18/2012  . Nausea and vomiting 08/18/2012  . Alcohol abuse 08/18/2012   Past Medical History  Diagnosis Date  . Alcohol abuse   . Hypertension   . Reflux   . Gout   . Hyperlipidemia   . GERD (gastroesophageal reflux disease)   . Atrial fibrillation     Past Surgical History  Procedure Laterality Date  . No past surgeries       (Not in a hospital admission) No Known Allergies  Social History  Substance Use Topics  . Smoking status: Never Smoker   . Smokeless tobacco: Never Used  . Alcohol Use: Yes     Comment: heavy use    Family History  Problem Relation Age of Onset  . CAD  Father      Review of Systems  Constitutional: Negative.   HENT: Negative.   Eyes: Negative.   Respiratory: Negative.   Cardiovascular: Negative.   Gastrointestinal: Negative.   Genitourinary: Negative.   Musculoskeletal: Positive for joint pain.  Skin: Negative.   Neurological: Negative.   Endo/Heme/Allergies: Negative.   Psychiatric/Behavioral: Negative.     Objective:  Physical Exam  Constitutional: He is oriented to person, place, and time. He appears well-developed and well-nourished.  HENT:  Head: Normocephalic and atraumatic.  Eyes: EOM are normal. Pupils are equal, round, and reactive to light.  Neck: Normal range of motion. Neck supple.  Cardiovascular: Normal rate, regular rhythm and intact distal pulses.   Respiratory: Effort normal and breath sounds normal. No respiratory distress.  GI: Soft. Bowel sounds are normal. He exhibits distension.  Genitourinary:  deferred  Musculoskeletal:       Right hip: He exhibits decreased range of motion.  Neurological: He is alert and oriented to person, place, and time. He has normal reflexes.  Skin: Skin is warm and dry.  Psychiatric: He has a normal mood and affect. His behavior is normal. Judgment and thought content normal.    Vital signs in last 24 hours: @  Labs:   Estimated body mass index is 34.23 kg/(m^2) as calculated from the following:   Height as of 09/22/12:  (1.676 m).   Weight as of 09/22/12: 96.163 kg (212 lb).   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 The bone quality appears to be adequate for age and reported activity level.  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 The treatment options including medical management,  injection therapy, arthroscopy and arthroplasty were discussed at length. The risks and benefits of total hip arthroplasty were presented and reviewed. The risks due to aseptic loosening, infection, stiffness, dislocation/subluxation,  thromboembolic complications and other imponderables were discussed.  The patient acknowledged the explanation, agreed to proceed with the plan and consent was signed. Patient is being admitted for inpatient treatment for surgery, pain control, PT, OT, prophylactic antibiotics, VTE prophylaxis, progressive ambulation and ADL's and discharge planning.The patient is planning to be discharged home with home health services

## 2015-05-14 NOTE — Patient Instructions (Addendum)
Steven Cooke  05/14/2015   Your procedure is scheduled on: 05/23/2015    [facility]  [facility]trance [facility]  elevators to 3rd floor to  Short Stay Center at   0515 AM.  Call this number if you have problems the morning of surgery [phone]   Remember: ONLY 1 PERSON MAY GO WITH YOU TO SHORT STAY TO GET  READY MORNING OF YOUR SURGERY.  Do not eat food or drink liquids :After Midnight.  Eat a good healthy snack prior to bedtime.    Take these medicines the morning of surgery with A SIP OF WATER: Buspar, Zyrtec if needed, Dxilant, Metoprolol ( Lopressor)  DO NOT TAKE ANY DIABETIC MEDICATIONS DAY OF YOUR SURGERY - DO NOT TAKE LEVEMIR THE MORNING OF SURGERY                               You may not have any metal on your body including hair pins and              piercings  Do not wear jewelry,  lotions, powders or perfumes, deodorant                          Men may shave face and neck.   Do not bring valuables to the hospital. [facility] IS NOT             RESPONSIBLE   FOR VALUABLES.  Contacts, dentures or bridgework may not be worn into surgery.  Leave suitcase in the car. After surgery it may be brought to your room.      Special Instructions:  Coughing and deep breathing exercises, leg exercises               Please read over the following fact sheets you were given: _____________________________________________________________________             [facility] - Pre[facility] Before surgery, you can play an important role.  Because skin is not sterile, your skin needs to be as free of germs as possible.  You can reduce the number of germs on your skin by washing with CHG (chlorahexidine gluconate) soap before surgery.  CHG is an antiseptic cleaner which kills germs and bonds with the skin to continue killing germs even after washing. Please DO NOT use if you have an allergy to CHG or antibacterial soaps.  If your skin becomes  reddened/irritated stop using the CHG and inform your nurse when you arrive at Short Stay. Do not shave (including legs and underarms) for at least 48 hours prior to the first CHG shower.  You may shave your face/neck. Please follow these instructions carefully:  1.  Shower with CHG Soap the night before surgery and the  morning of Surgery.  2.  If you choose to wash your hair, wash your hair first as usual with your  normal  shampoo.  3.  After you shampoo, rinse your hair and body thoroughly to remove the  shampoo.                           4.  Use CHG as you would any other liquid soap.  You can apply chg directly  to the skin and wash  Gently with a scrungie or clean washcloth.  5.  Apply the CHG Soap to your body ONLY FROM THE NECK DOWN.   Do not use on face/ open                           Wound or open sores. Avoid contact with eyes, ears mouth and genitals (private parts).                       Wash face,  Genitals (private parts) with your normal soap.             6.  Wash thoroughly, paying special attention to the area where your surgery  will be performed.  7.  Thoroughly rinse your body with warm water from the neck down.  8.  DO NOT shower/wash with your normal soap after using and rinsing off  the CHG Soap.                9.  Pat yourself dry with a clean towel.            10.  Wear clean pajamas.            11.  Place clean sheets on your bed the night of your first shower and do not  sleep with pets. Day of Surgery : Do not apply any lotions/deodorants the morning of surgery.  Please wear clean clothes to the hospital/surgery center.  FAILURE TO FOLLOW THESE INSTRUCTIONS MAY RESULT IN THE CANCELLATION OF YOUR SURGERY PATIENT SIGNATURE_________________________________  NURSE SIGNATURE__________________________________  ________________________________________________________________________  WHAT IS A BLOOD TRANSFUSION? Blood Transfusion Information  A  transfusion is the replacement of blood or some of its parts. Blood is made up of multiple cells which provide different functions.  Red blood cells carry oxygen and are used for blood loss replacement.  White blood cells fight against infection.  Platelets control bleeding.  Plasma helps clot blood.  Other blood products are available for specialized needs, such as hemophilia or other clotting disorders. BEFORE THE TRANSFUSION  Who gives blood for transfusions?   Healthy volunteers who are fully evaluated to make sure their blood is safe. This is blood bank blood. Transfusion therapy is the safest it has ever been in the practice of medicine. Before blood is taken from a donor, a complete history is taken to make sure that person has no history of diseases nor engages in risky social behavior (examples are intravenous drug use or sexual activity with multiple partners). The donor's travel history is screened to minimize risk of transmitting infections, such as malaria. The donated blood is tested for signs of infectious diseases, such as HIV and hepatitis. The blood is then tested to be sure it is compatible with you in order to minimize the chance of a transfusion reaction. If you or a relative donates blood, this is often done in anticipation of surgery and is not appropriate for emergency situations. It takes many days to process the donated blood. RISKS AND COMPLICATIONS Although transfusion therapy is very safe and saves many lives, the main dangers of transfusion include:  1. Getting an infectious disease. 2. Developing a transfusion reaction. This is an allergic reaction to something in the blood you were given. Every precaution is taken to prevent this. The decision to have a blood transfusion has been considered carefully by your caregiver before blood is given. Blood is not given unless the benefits outweigh  the risks. AFTER THE TRANSFUSION  Right after receiving a blood  transfusion, you will usually feel much better and more energetic. This is especially true if your red blood cells have gotten low (anemic). The transfusion raises the level of the red blood cells which carry oxygen, and this usually causes an energy increase.  The nurse administering the transfusion will monitor you carefully for complications. HOME CARE INSTRUCTIONS  No special instructions are needed after a transfusion. You may find your energy is better. Speak with your caregiver about any limitations on activity for underlying diseases you may have. SEEK MEDICAL CARE IF:   Your condition is not improving after your transfusion.  You develop redness or irritation at the intravenous (IV) site. SEEK IMMEDIATE MEDICAL CARE IF:  Any of the following symptoms occur over the next 12 hours:  Shaking chills.  You have a temperature by mouth above 102 F (38.9 C), not controlled by medicine.  Chest, back, or muscle pain.  People around you feel you are not acting correctly or are confused.  Shortness of breath or difficulty breathing.  Dizziness and fainting.  You get a rash or develop hives.  You have a decrease in urine output.  Your urine turns a dark color or changes to pink, red, or brown. Any of the following symptoms occur over the next 10 days:  You have a temperature by mouth above 102 F (38.9 C), not controlled by medicine.  Shortness of breath.  Weakness after normal activity.  The white part of the eye turns yellow (jaundice).  You have a decrease in the amount of urine or are urinating less often.  Your urine turns a dark color or changes to pink, red, or brown. Document Released: 05/14/2000 Document Revised: 08/09/2011 Document Reviewed: 01/01/2008 ExitCare Patient Information 2014 [place].  _______________________________________________________________________  Incentive Spirometer  An incentive spirometer is a tool that can help keep your  lungs clear and active. This tool measures how well you are filling your lungs with each breath. Taking long deep breaths may help reverse or decrease the chance of developing breathing (pulmonary) problems (especially infection) following:  A long period of time when you are unable to move or be active. BEFORE THE PROCEDURE   If the spirometer includes an indicator to show your best effort, your nurse or respiratory therapist will set it to a desired goal.  If possible, sit up straight or lean slightly forward. Try not to slouch.  Hold the incentive spirometer in an upright position. INSTRUCTIONS FOR USE  3. Sit on the edge of your bed if possible, or sit up as far as you can in bed or on a chair. 4. Hold the incentive spirometer in an upright position. 5. Breathe out normally. 6. Place the mouthpiece in your mouth and seal your lips tightly around it. 7. Breathe in slowly and as deeply as possible, raising the piston or the ball toward the top of the column. 8. Hold your breath for 3-5 seconds or for as long as possible. Allow the piston or ball to fall to the bottom of the column. 9. Remove the mouthpiece from your mouth and breathe out normally. 10. Rest for a few seconds and repeat Steps 1 through 7 at least 10 times every 1-2 hours when you are awake. Take your time and take a few normal breaths between deep breaths. 11. The spirometer may include an indicator to show your best effort. Use the indicator as a goal to work  toward during each repetition. 12. After each set of 10 deep breaths, practice coughing to be sure your lungs are clear. If you have an incision (the cut made at the time of surgery), support your incision when coughing by placing a pillow or rolled up towels firmly against it. Once you are able to get out of bed, walk around indoors and cough well. You may stop using the incentive spirometer when instructed by your caregiver.  RISKS AND COMPLICATIONS  Take your time so  you do not get dizzy or light-headed.  If you are in pain, you may need to take or ask for pain medication before doing incentive spirometry. It is harder to take a deep breath if you are having pain. AFTER USE  Rest and breathe slowly and easily.  It can be helpful to keep track of a log of your progress. Your caregiver can provide you with a simple table to help with this. If you are using the spirometer at home, follow these instructions: [place] IF:   You are having difficultly using the spirometer.  You have trouble using the spirometer as often as instructed.  Your pain medication is not giving enough relief while using the spirometer.  You develop fever of 100.5 F (38.1 C) or higher. SEEK IMMEDIATE MEDICAL CARE IF:   You cough up bloody sputum that had not been present before.  You develop fever of 102 F (38.9 C) or greater.  You develop worsening pain at or near the incision site. MAKE SURE YOU:   Understand these instructions.  Will watch your condition.  Will get help right away if you are not doing well or get worse. Document Released: 09/27/2006 Document Revised: 08/09/2011 Document Reviewed: 11/28/2006 [facility] Patient Information 2014 [place], [place].   ________________________________________________________________________

## 2015-05-16 ENCOUNTER — Encounter (HOSPITAL_COMMUNITY): Payer: Self-pay

## 2015-05-16 ENCOUNTER — Encounter (HOSPITAL_COMMUNITY)
Admission: RE | Admit: 2015-05-16 | Discharge: 2015-05-16 | Disposition: A | Discharge status: Home / Self Care | Payer: 59 | Source: Ambulatory Visit | Attending: Orthopedic Surgery | Admitting: Orthopedic Surgery

## 2015-05-16 DIAGNOSIS — M87051 Idiopathic aseptic necrosis of right femur: Secondary | ICD-10-CM | POA: Insufficient documentation

## 2015-05-16 DIAGNOSIS — Z01810 Encounter for preprocedural cardiovascular examination: Secondary | ICD-10-CM | POA: Insufficient documentation

## 2015-05-16 DIAGNOSIS — Z01812 Encounter for preprocedural laboratory examination: Secondary | ICD-10-CM | POA: Insufficient documentation

## 2015-05-16 HISTORY — DX: Cardiac arrhythmia, unspecified: I49.9

## 2015-05-16 HISTORY — DX: Depression, unspecified: F32.A

## 2015-05-16 HISTORY — DX: Type 2 diabetes mellitus without complications: E11.9

## 2015-05-16 HISTORY — DX: Unspecified osteoarthritis, unspecified site: M19.90

## 2015-05-16 HISTORY — DX: Major depressive disorder, single episode, unspecified: F32.9

## 2015-05-16 HISTORY — DX: Anxiety disorder, unspecified: F41.9

## 2015-05-16 LAB — CBC
HCT: 43.3 % (ref 39.0–52.0)
Hemoglobin: 14.6 g/dL (ref 13.0–17.0)
MCH: 31.1 pg (ref 26.0–34.0)
MCHC: 33.7 g/dL (ref 30.0–36.0)
MCV: 92.1 fL (ref 78.0–100.0)
PLATELETS: 204 10*3/uL (ref 150–400)
RBC: 4.7 MIL/uL (ref 4.22–5.81)
RDW: 11.8 % (ref 11.5–15.5)
WBC: 4.8 10*3/uL (ref 4.0–10.5)

## 2015-05-16 LAB — SURGICAL PCR SCREEN
MRSA, PCR: NEGATIVE
STAPHYLOCOCCUS AUREUS: NEGATIVE

## 2015-05-16 LAB — URINALYSIS, ROUTINE W REFLEX MICROSCOPIC
BILIRUBIN URINE: NEGATIVE
Glucose, UA: NEGATIVE mg/dL
HGB URINE DIPSTICK: NEGATIVE
KETONES UR: NEGATIVE mg/dL
Leukocytes, UA: NEGATIVE
Nitrite: NEGATIVE
PROTEIN: NEGATIVE mg/dL
SPECIFIC GRAVITY, URINE: 1.004 — AB (ref 1.005–1.030)
pH: 6.5 (ref 5.0–8.0)

## 2015-05-16 LAB — COMPREHENSIVE METABOLIC PANEL
ALBUMIN: 5.1 g/dL — AB (ref 3.5–5.0)
ALT: 76 U/L — ABNORMAL HIGH (ref 17–63)
ANION GAP: 12 (ref 5–15)
AST: 60 U/L — ABNORMAL HIGH (ref 15–41)
Alkaline Phosphatase: 23 U/L — ABNORMAL LOW (ref 38–126)
BILIRUBIN TOTAL: 0.7 mg/dL (ref 0.3–1.2)
BUN: 8 mg/dL (ref 6–20)
CHLORIDE: 100 mmol/L — AB (ref 101–111)
CO2: 27 mmol/L (ref 22–32)
Calcium: 9.6 mg/dL (ref 8.9–10.3)
Creatinine, Ser: 0.85 mg/dL (ref 0.61–1.24)
GFR calc Af Amer: >60 mL/min (ref >60)
GFR calc non Af Amer: >60 mL/min (ref >60)
GLUCOSE: 103 mg/dL — AB (ref 65–99)
POTASSIUM: 4.3 mmol/L (ref 3.5–5.1)
SODIUM: 139 mmol/L (ref 135–145)
TOTAL PROTEIN: 8.1 g/dL (ref 6.5–8.1)

## 2015-05-16 LAB — APTT: APTT: 27 s (ref 24–37)

## 2015-05-16 LAB — PROTIME-INR
INR: 1.02 (ref 0.00–1.49)
Prothrombin Time: 13.6 seconds (ref 11.6–15.2)

## 2015-05-16 LAB — ABO/RH: ABO/RH(D): AB POS

## 2015-05-16 NOTE — Pre-Procedure Instructions (Addendum)
Echo 2014 epic [facility] Cardiology, 2014, epic [facility], Dr. Thomasena Edisollins, on chart  Spoke with pt regarding cardiac history.  He is not being followed by a cardiologist.  He had a one-time visit after having gastroenteritis and vomiting and therefore could not keep alcohol down and had DT's which led to A-fib w RVR.  Pt is having EKG done today at pre-op visit.

## 2015-05-17 LAB — HEMOGLOBIN A1C
Hgb A1c MFr Bld: 6 % — ABNORMAL HIGH (ref 4.8–5.6)
Mean Plasma Glucose: 126 mg/dL

## 2015-05-19 NOTE — Progress Notes (Signed)
Final EKG in EPIC done 05/16/15.

## 2015-05-22 ENCOUNTER — Encounter (HOSPITAL_COMMUNITY): Payer: Self-pay | Admitting: Anesthesiology

## 2015-05-22 NOTE — Anesthesia Preprocedure Evaluation (Addendum)
Anesthesia Evaluation  Patient identified by MRN, date of birth, ID band Patient awake    Reviewed: Allergy & Precautions, NPO status , Patient's Chart, lab work & pertinent test results  Airway Mallampati: II  TM Distance: >3 FB Neck ROM: Full    Dental no notable dental hx.    Pulmonary neg pulmonary ROS,    Pulmonary exam normal breath sounds clear to auscultation       Cardiovascular Exercise Tolerance: Good hypertension, Pt. on medications and Pt. on home beta blockers Normal cardiovascular exam+ dysrhythmias Atrial Fibrillation  Rhythm:Regular Rate:Normal     Neuro/Psych PSYCHIATRIC DISORDERS Anxiety Depression negative neurological ROS     GI/Hepatic Neg liver ROS, GERD  Medicated,  Endo/Other  diabetes, Type 2, Insulin Dependent, Oral Hypoglycemic Agents  Renal/GU negative Renal ROS  negative genitourinary   Musculoskeletal  (+) Arthritis ,   Abdominal   Peds negative pediatric ROS (+)  Hematology negative hematology ROS (+)   Anesthesia Other Findings   Reproductive/Obstetrics negative OB ROS                            Anesthesia Physical Anesthesia Plan  ASA: III  Anesthesia Plan: Spinal   Post-op Pain Management:    Induction: Intravenous  Airway Management Planned: Natural Airway  Additional Equipment:   Intra-op Plan:   Post-operative Plan:   Informed Consent: I have reviewed the patients History and Physical, chart, labs and discussed the procedure including the risks, benefits and alternatives for the proposed anesthesia with the patient or authorized representative who has indicated his/her understanding and acceptance.   Dental advisory given  Plan Discussed with: CRNA  Anesthesia Plan Comments: (Discussed risks and benefits of and differences between spinal and general. Discussed risks of spinal including headache, backache, failure, bleeding and  hematoma, infection, and nerve damage. Patient consents to spinal. Questions answered. Coagulation studies and platelet count acceptable.)       Anesthesia Quick Evaluation

## 2015-05-23 ENCOUNTER — Inpatient Hospital Stay (HOSPITAL_COMMUNITY): Payer: 59

## 2015-05-23 ENCOUNTER — Encounter (HOSPITAL_COMMUNITY)
Admission: RE | Disposition: A | Discharge status: Home Health | Payer: Self-pay | Source: Ambulatory Visit | Attending: Orthopedic Surgery

## 2015-05-23 ENCOUNTER — Inpatient Hospital Stay (HOSPITAL_COMMUNITY)
Admission: RE | Admit: 2015-05-23 | Discharge: 2015-05-24 | DRG: 470 | Disposition: A | Discharge status: Home Health | Payer: 59 | Source: Ambulatory Visit | Attending: Orthopedic Surgery | Admitting: Orthopedic Surgery

## 2015-05-23 ENCOUNTER — Inpatient Hospital Stay (HOSPITAL_COMMUNITY): Payer: 59 | Admitting: Certified Registered Nurse Anesthetist

## 2015-05-23 ENCOUNTER — Encounter (HOSPITAL_COMMUNITY): Payer: Self-pay | Admitting: *Deleted

## 2015-05-23 DIAGNOSIS — M87051 Idiopathic aseptic necrosis of right femur: Principal | ICD-10-CM | POA: Diagnosis present

## 2015-05-23 DIAGNOSIS — I10 Essential (primary) hypertension: Secondary | ICD-10-CM | POA: Diagnosis present

## 2015-05-23 DIAGNOSIS — Z01812 Encounter for preprocedural laboratory examination: Secondary | ICD-10-CM | POA: Diagnosis not present

## 2015-05-23 DIAGNOSIS — K219 Gastro-esophageal reflux disease without esophagitis: Secondary | ICD-10-CM | POA: Diagnosis present

## 2015-05-23 DIAGNOSIS — E119 Type 2 diabetes mellitus without complications: Secondary | ICD-10-CM | POA: Diagnosis present

## 2015-05-23 DIAGNOSIS — M25551 Pain in right hip: Secondary | ICD-10-CM | POA: Diagnosis present

## 2015-05-23 DIAGNOSIS — Z09 Encounter for follow-up examination after completed treatment for conditions other than malignant neoplasm: Secondary | ICD-10-CM

## 2015-05-23 DIAGNOSIS — I4891 Unspecified atrial fibrillation: Secondary | ICD-10-CM | POA: Diagnosis present

## 2015-05-23 HISTORY — DX: Idiopathic aseptic necrosis of right femur: M87.051

## 2015-05-23 HISTORY — PX: TOTAL HIP ARTHROPLASTY: SHX124

## 2015-05-23 LAB — GLUCOSE, CAPILLARY
GLUCOSE-CAPILLARY: 141 mg/dL — AB (ref 65–99)
GLUCOSE-CAPILLARY: 135 mg/dL — AB (ref 65–99)
Glucose-Capillary: 127 mg/dL — ABNORMAL HIGH (ref 65–99)
Glucose-Capillary: 173 mg/dL — ABNORMAL HIGH (ref 65–99)

## 2015-05-23 SURGERY — ARTHROPLASTY, HIP, TOTAL, ANTERIOR APPROACH
Anesthesia: Spinal | Site: Hip | Laterality: Right

## 2015-05-23 MED ORDER — BUPIVACAINE HCL (PF) 0.5 % IJ SOLN
INTRAMUSCULAR | Status: DC | PRN
  Administered 2015-05-23: 3 mL

## 2015-05-23 MED ORDER — CEFAZOLIN SODIUM-DEXTROSE 2-3 GM-% IV SOLR
2.0000 g | Freq: Four times a day (QID) | INTRAVENOUS | Status: AC
Start: 2015-05-23 — End: 2015-05-23
  Administered 2015-05-23 (×2): 2 g via INTRAVENOUS
  Filled 2015-05-23 (×2): qty 50

## 2015-05-23 MED ORDER — ONDANSETRON HCL 4 MG/2ML IJ SOLN
4.0000 mg | Freq: Four times a day (QID) | INTRAMUSCULAR | Status: DC | PRN
  Administered 2015-05-23 (×2): 4 mg via INTRAVENOUS
  Filled 2015-05-23 (×2): qty 2

## 2015-05-23 MED ORDER — PROPOFOL 10 MG/ML IV BOLUS
INTRAVENOUS | Status: AC
  Filled 2015-05-23: qty 20

## 2015-05-23 MED ORDER — INSULIN ASPART 100 UNIT/ML ~~LOC~~ SOLN
0.0000 [IU] | Freq: Three times a day (TID) | SUBCUTANEOUS | Status: DC
  Administered 2015-05-23: 3 [IU] via SUBCUTANEOUS

## 2015-05-23 MED ORDER — ONDANSETRON 8 MG PO TBDP: 8.0000 mg | ORAL_TABLET | Freq: Three times a day (TID) | ORAL | Status: AC | PRN

## 2015-05-23 MED ORDER — METFORMIN HCL 500 MG PO TABS
1000.0000 mg | ORAL_TABLET | Freq: Two times a day (BID) | ORAL | Status: DC
  Administered 2015-05-24: 1000 mg via ORAL
  Filled 2015-05-23 (×4): qty 2

## 2015-05-23 MED ORDER — DEXAMETHASONE SODIUM PHOSPHATE 10 MG/ML IJ SOLN
INTRAMUSCULAR | Status: DC | PRN
  Administered 2015-05-23: 10 mg via INTRAVENOUS

## 2015-05-23 MED ORDER — FENTANYL CITRATE (PF) 100 MCG/2ML IJ SOLN
INTRAMUSCULAR | Status: DC | PRN
  Administered 2015-05-23 (×6): 50 ug via INTRAVENOUS

## 2015-05-23 MED ORDER — PROPOFOL 10 MG/ML IV BOLUS
INTRAVENOUS | Status: AC
  Filled 2015-05-23: qty 60

## 2015-05-23 MED ORDER — SIMVASTATIN 20 MG PO TABS
20.0000 mg | ORAL_TABLET | Freq: Every day | ORAL | Status: DC
  Administered 2015-05-23 – 2015-05-24 (×2): 20 mg via ORAL
  Filled 2015-05-23 (×2): qty 1

## 2015-05-23 MED ORDER — BUPIVACAINE HCL (PF) 0.5 % IJ SOLN
INTRAMUSCULAR | Status: AC
  Filled 2015-05-23: qty 30

## 2015-05-23 MED ORDER — DEXLANSOPRAZOLE 60 MG PO CPDR
60.0000 mg | DELAYED_RELEASE_CAPSULE | Freq: Every day | ORAL | Status: DC
  Administered 2015-05-24: 60 mg via ORAL
  Filled 2015-05-23: qty 1

## 2015-05-23 MED ORDER — FENOFIBRATE 160 MG PO TABS
160.0000 mg | ORAL_TABLET | Freq: Every day | ORAL | Status: DC
  Administered 2015-05-23 – 2015-05-24 (×2): 160 mg via ORAL
  Filled 2015-05-23 (×2): qty 1

## 2015-05-23 MED ORDER — HYDROCODONE-ACETAMINOPHEN 5-325 MG PO TABS
1.0000 | ORAL_TABLET | ORAL | Status: DC | PRN
  Administered 2015-05-23 – 2015-05-24 (×5): 2 via ORAL
  Filled 2015-05-23 (×5): qty 2

## 2015-05-23 MED ORDER — ONDANSETRON HCL 4 MG/2ML IJ SOLN
INTRAMUSCULAR | Status: DC | PRN
  Administered 2015-05-23: 4 mg via INTRAVENOUS

## 2015-05-23 MED ORDER — DM-GUAIFENESIN ER 30-600 MG PO TB12
1.0000 | ORAL_TABLET | Freq: Two times a day (BID) | ORAL | Status: DC
  Administered 2015-05-23 – 2015-05-24 (×2): 1 via ORAL
  Filled 2015-05-23 (×4): qty 1

## 2015-05-23 MED ORDER — KETOROLAC TROMETHAMINE 30 MG/ML IJ SOLN
INTRAMUSCULAR | Status: AC
  Filled 2015-05-23: qty 1

## 2015-05-23 MED ORDER — METHOCARBAMOL 500 MG PO TABS
500.0000 mg | ORAL_TABLET | Freq: Four times a day (QID) | ORAL | Status: DC | PRN
  Administered 2015-05-23 – 2015-05-24 (×3): 500 mg via ORAL
  Filled 2015-05-23 (×2): qty 1

## 2015-05-23 MED ORDER — FENTANYL CITRATE (PF) 100 MCG/2ML IJ SOLN
INTRAMUSCULAR | Status: AC
  Filled 2015-05-23: qty 2

## 2015-05-23 MED ORDER — SODIUM CHLORIDE 0.9 % IR SOLN
Status: DC | PRN
  Administered 2015-05-23: 1000 mL

## 2015-05-23 MED ORDER — TRAZODONE HCL 50 MG PO TABS
150.0000 mg | ORAL_TABLET | Freq: Every evening | ORAL | Status: DC | PRN
  Administered 2015-05-23: 150 mg via ORAL
  Filled 2015-05-23: qty 3

## 2015-05-23 MED ORDER — METOCLOPRAMIDE HCL 5 MG/ML IJ SOLN: 5.0000 mg | Freq: Three times a day (TID) | INTRAMUSCULAR | Status: DC | PRN

## 2015-05-23 MED ORDER — PHENOL 1.4 % MT LIQD
1.0000 | OROMUCOSAL | Status: DC | PRN
  Filled 2015-05-23: qty 177

## 2015-05-23 MED ORDER — ASPIRIN EC 325 MG PO TBEC: 325.0000 mg | DELAYED_RELEASE_TABLET | Freq: Two times a day (BID) | ORAL | Status: DC

## 2015-05-23 MED ORDER — PHENYLEPHRINE HCL 10 MG/ML IJ SOLN
INTRAMUSCULAR | Status: DC | PRN
  Administered 2015-05-23: 40 ug via INTRAVENOUS
  Administered 2015-05-23: 80 ug via INTRAVENOUS
  Administered 2015-05-23 (×3): 40 ug via INTRAVENOUS
  Administered 2015-05-23: 80 ug via INTRAVENOUS
  Administered 2015-05-23: 40 ug via INTRAVENOUS
  Administered 2015-05-23 (×3): 80 ug via INTRAVENOUS
  Administered 2015-05-23: 40 ug via INTRAVENOUS
  Administered 2015-05-23: 80 ug via INTRAVENOUS
  Administered 2015-05-23: 40 ug via INTRAVENOUS

## 2015-05-23 MED ORDER — DOCUSATE SODIUM 100 MG PO CAPS
100.0000 mg | ORAL_CAPSULE | Freq: Two times a day (BID) | ORAL | Status: DC
  Administered 2015-05-23 – 2015-05-24 (×2): 100 mg via ORAL

## 2015-05-23 MED ORDER — PHENYLEPHRINE 40 MCG/ML (10ML) SYRINGE FOR IV PUSH (FOR BLOOD PRESSURE SUPPORT)
PREFILLED_SYRINGE | INTRAVENOUS | Status: AC
  Filled 2015-05-23: qty 30

## 2015-05-23 MED ORDER — HYDROGEN PEROXIDE 3 % EX SOLN
CUTANEOUS | Status: AC
  Filled 2015-05-23: qty 473

## 2015-05-23 MED ORDER — BUPIVACAINE-EPINEPHRINE 0.25% -1:200000 IJ SOLN
INTRAMUSCULAR | Status: DC | PRN
  Administered 2015-05-23: 30 mL

## 2015-05-23 MED ORDER — KETOROLAC TROMETHAMINE 30 MG/ML IJ SOLN
INTRAMUSCULAR | Status: DC | PRN
  Administered 2015-05-23: 30 mg

## 2015-05-23 MED ORDER — LACTATED RINGERS IV SOLN
INTRAVENOUS | Status: DC | PRN
  Administered 2015-05-23 (×3): via INTRAVENOUS

## 2015-05-23 MED ORDER — FENTANYL CITRATE (PF) 100 MCG/2ML IJ SOLN
INTRAMUSCULAR | Status: AC
Start: 2015-05-23 — End: 2015-05-23
  Filled 2015-05-23: qty 2

## 2015-05-23 MED ORDER — CEFAZOLIN SODIUM-DEXTROSE 2-3 GM-% IV SOLR
2.0000 g | INTRAVENOUS | Status: AC
  Administered 2015-05-23: 2 g via INTRAVENOUS

## 2015-05-23 MED ORDER — ACETAMINOPHEN 650 MG RE SUPP: 650.0000 mg | Freq: Four times a day (QID) | RECTAL | Status: DC | PRN

## 2015-05-23 MED ORDER — INSULIN DETEMIR 100 UNIT/ML ~~LOC~~ SOLN
30.0000 [IU] | Freq: Every day | SUBCUTANEOUS | Status: DC
  Administered 2015-05-24: 30 [IU] via SUBCUTANEOUS
  Filled 2015-05-23 (×2): qty 0.3

## 2015-05-23 MED ORDER — PROPOFOL 10 MG/ML IV BOLUS
INTRAVENOUS | Status: AC
  Filled 2015-05-23: qty 40

## 2015-05-23 MED ORDER — ACETAMINOPHEN 325 MG PO TABS: 650.0000 mg | ORAL_TABLET | Freq: Four times a day (QID) | ORAL | Status: DC | PRN

## 2015-05-23 MED ORDER — MIDAZOLAM HCL 2 MG/2ML IJ SOLN
INTRAMUSCULAR | Status: AC
  Filled 2015-05-23: qty 2

## 2015-05-23 MED ORDER — ASPIRIN EC 325 MG PO TBEC
325.0000 mg | DELAYED_RELEASE_TABLET | Freq: Two times a day (BID) | ORAL | Status: DC
  Administered 2015-05-24: 325 mg via ORAL
  Filled 2015-05-23 (×3): qty 1

## 2015-05-23 MED ORDER — SENNA 8.6 MG PO TABS
2.0000 | ORAL_TABLET | Freq: Every day | ORAL | Status: DC
  Administered 2015-05-23: 17.2 mg via ORAL

## 2015-05-23 MED ORDER — DOCUSATE SODIUM 100 MG PO CAPS: 100.0000 mg | ORAL_CAPSULE | Freq: Two times a day (BID) | ORAL | Status: AC

## 2015-05-23 MED ORDER — BUPIVACAINE HCL (PF) 0.25 % IJ SOLN
INTRAMUSCULAR | Status: AC
  Filled 2015-05-23: qty 30

## 2015-05-23 MED ORDER — HYDROGEN PEROXIDE 3 % EX SOLN
CUTANEOUS | Status: DC | PRN
  Administered 2015-05-23: 1

## 2015-05-23 MED ORDER — BUPIVACAINE-EPINEPHRINE (PF) 0.25% -1:200000 IJ SOLN
INTRAMUSCULAR | Status: AC
  Filled 2015-05-23: qty 30

## 2015-05-23 MED ORDER — CEFAZOLIN SODIUM-DEXTROSE 2-3 GM-% IV SOLR
INTRAVENOUS | Status: AC
  Filled 2015-05-23: qty 50

## 2015-05-23 MED ORDER — HYDROMORPHONE HCL 1 MG/ML IJ SOLN: 0.2500 mg | INTRAMUSCULAR | Status: DC | PRN

## 2015-05-23 MED ORDER — DEXAMETHASONE SODIUM PHOSPHATE 10 MG/ML IJ SOLN
10.0000 mg | Freq: Once | INTRAMUSCULAR | Status: AC
  Administered 2015-05-24: 10 mg via INTRAVENOUS
  Filled 2015-05-23: qty 1

## 2015-05-23 MED ORDER — SENNA 8.6 MG PO TABS: 2.0000 | ORAL_TABLET | Freq: Every day | ORAL | Status: AC

## 2015-05-23 MED ORDER — PROPOFOL 500 MG/50ML IV EMUL
INTRAVENOUS | Status: DC | PRN
  Administered 2015-05-23: 100 ug/kg/min via INTRAVENOUS

## 2015-05-23 MED ORDER — ONDANSETRON HCL 4 MG PO TABS: 4.0000 mg | ORAL_TABLET | Freq: Four times a day (QID) | ORAL | Status: DC | PRN

## 2015-05-23 MED ORDER — SODIUM CHLORIDE 0.9 % IV SOLN: INTRAVENOUS | Status: DC

## 2015-05-23 MED ORDER — PROPOFOL 10 MG/ML IV BOLUS
INTRAVENOUS | Status: DC | PRN
  Administered 2015-05-23 (×3): 20 mg via INTRAVENOUS
  Administered 2015-05-23 (×2): 30 mg via INTRAVENOUS
  Administered 2015-05-23 (×4): 20 mg via INTRAVENOUS
  Administered 2015-05-23 (×2): 30 mg via INTRAVENOUS
  Administered 2015-05-23: 20 mg via INTRAVENOUS

## 2015-05-23 MED ORDER — METOCLOPRAMIDE HCL 10 MG PO TABS: 5.0000 mg | ORAL_TABLET | Freq: Three times a day (TID) | ORAL | Status: DC | PRN

## 2015-05-23 MED ORDER — LORATADINE 10 MG PO TABS
10.0000 mg | ORAL_TABLET | Freq: Every day | ORAL | Status: DC
  Administered 2015-05-23 – 2015-05-24 (×2): 10 mg via ORAL
  Filled 2015-05-23 (×2): qty 1

## 2015-05-23 MED ORDER — SODIUM CHLORIDE 0.9 % IJ SOLN
INTRAMUSCULAR | Status: AC
  Filled 2015-05-23: qty 50

## 2015-05-23 MED ORDER — METHOCARBAMOL 1000 MG/10ML IJ SOLN
500.0000 mg | Freq: Four times a day (QID) | INTRAVENOUS | Status: DC | PRN
  Filled 2015-05-23: qty 5

## 2015-05-23 MED ORDER — KETOROLAC TROMETHAMINE 15 MG/ML IJ SOLN
15.0000 mg | Freq: Four times a day (QID) | INTRAMUSCULAR | Status: AC
Start: 2015-05-23 — End: 2015-05-24
  Administered 2015-05-23 – 2015-05-24 (×4): 15 mg via INTRAVENOUS
  Filled 2015-05-23 (×4): qty 1

## 2015-05-23 MED ORDER — MENTHOL 3 MG MT LOZG: 1.0000 | LOZENGE | OROMUCOSAL | Status: DC | PRN

## 2015-05-23 MED ORDER — DEXAMETHASONE SODIUM PHOSPHATE 10 MG/ML IJ SOLN
INTRAMUSCULAR | Status: AC
  Filled 2015-05-23: qty 1

## 2015-05-23 MED ORDER — METOPROLOL TARTRATE 50 MG PO TABS
50.0000 mg | ORAL_TABLET | Freq: Two times a day (BID) | ORAL | Status: DC
  Administered 2015-05-23 – 2015-05-24 (×2): 50 mg via ORAL
  Filled 2015-05-23 (×3): qty 1

## 2015-05-23 MED ORDER — LIDOCAINE HCL (CARDIAC) 20 MG/ML IV SOLN
INTRAVENOUS | Status: DC | PRN
  Administered 2015-05-23: 40 mg via INTRAVENOUS

## 2015-05-23 MED ORDER — ISOPROPYL ALCOHOL 70 % SOLN
Status: DC | PRN
  Administered 2015-05-23: 1 via TOPICAL

## 2015-05-23 MED ORDER — BUSPIRONE HCL 10 MG PO TABS
10.0000 mg | ORAL_TABLET | Freq: Every day | ORAL | Status: DC
  Administered 2015-05-23 – 2015-05-24 (×2): 10 mg via ORAL
  Filled 2015-05-23 (×2): qty 1

## 2015-05-23 MED ORDER — HYDROCODONE-ACETAMINOPHEN 5-325 MG PO TABS: 1.0000 | ORAL_TABLET | ORAL | Status: DC | PRN

## 2015-05-23 MED ORDER — PROMETHAZINE HCL 25 MG/ML IJ SOLN: 6.2500 mg | INTRAMUSCULAR | Status: DC | PRN

## 2015-05-23 MED ORDER — TRANEXAMIC ACID 1000 MG/10ML IV SOLN
1000.0000 mg | INTRAVENOUS | Status: AC
  Administered 2015-05-23: 1000 mg via INTRAVENOUS
  Filled 2015-05-23: qty 10

## 2015-05-23 MED ORDER — SODIUM CHLORIDE 0.9 % IJ SOLN
INTRAMUSCULAR | Status: DC | PRN
  Administered 2015-05-23: 30 mL

## 2015-05-23 MED ORDER — CHLORHEXIDINE GLUCONATE 4 % EX LIQD: 60.0000 mL | Freq: Once | CUTANEOUS | Status: DC

## 2015-05-23 MED ORDER — WATER FOR IRRIGATION, STERILE IR SOLN
Status: DC | PRN
  Administered 2015-05-23: 1000 mL

## 2015-05-23 MED ORDER — HYDROMORPHONE HCL 1 MG/ML IJ SOLN
0.5000 mg | INTRAMUSCULAR | Status: DC | PRN
  Administered 2015-05-23 (×4): 0.5 mg via INTRAVENOUS
  Filled 2015-05-23 (×5): qty 1

## 2015-05-23 MED ORDER — MIDAZOLAM HCL 5 MG/5ML IJ SOLN
INTRAMUSCULAR | Status: DC | PRN
  Administered 2015-05-23 (×2): 2 mg via INTRAVENOUS

## 2015-05-23 MED ORDER — SODIUM CHLORIDE 0.9 % IV SOLN
INTRAVENOUS | Status: DC
  Administered 2015-05-23: 150 mL/h via INTRAVENOUS
  Administered 2015-05-24: 01:00:00 via INTRAVENOUS

## 2015-05-23 MED ORDER — ONDANSETRON HCL 4 MG/2ML IJ SOLN
INTRAMUSCULAR | Status: AC
  Filled 2015-05-23: qty 2

## 2015-05-23 MED ORDER — LACTATED RINGERS IV SOLN
INTRAVENOUS | Status: DC
  Administered 2015-05-23: 1000 mL via INTRAVENOUS

## 2015-05-23 SURGICAL SUPPLY — 44 items
BAG DECANTER FOR FLEXI CONT (MISCELLANEOUS) IMPLANT
BAG SPEC THK2 15X12 ZIP CLS (MISCELLANEOUS)
BAG ZIPLOCK 12X15 (MISCELLANEOUS) IMPLANT
CAPT HIP TOTAL 2 ×2 IMPLANT
CHLORAPREP W/TINT 26ML (MISCELLANEOUS) ×3 IMPLANT
CLOTH BEACON ORANGE TIMEOUT ST (SAFETY) ×3 IMPLANT
COVER PERINEAL POST (MISCELLANEOUS) ×3 IMPLANT
DECANTER SPIKE VIAL GLASS SM (MISCELLANEOUS) ×3 IMPLANT
DRAPE LG THREE QUARTER DISP (DRAPES) ×6 IMPLANT
DRAPE STERI IOBAN 125X83 (DRAPES) ×3 IMPLANT
DRAPE U-SHAPE 47X51 STRL (DRAPES) ×6 IMPLANT
DRSG AQUACEL AG ADV 3.5X10 (GAUZE/BANDAGES/DRESSINGS) ×3 IMPLANT
ELECT REM PT RETURN 15FT ADLT (MISCELLANEOUS) ×3 IMPLANT
GAUZE SPONGE 4X4 12PLY STRL (GAUZE/BANDAGES/DRESSINGS) ×3 IMPLANT
GLOVE BIO SURGEON STRL SZ8.5 (GLOVE) ×6 IMPLANT
GLOVE BIOGEL PI IND STRL 8.5 (GLOVE) ×1 IMPLANT
GLOVE BIOGEL PI INDICATOR 8.5 (GLOVE) ×2
GOWN SPEC L3 XXLG W/TWL (GOWN DISPOSABLE) ×3 IMPLANT
HANDPIECE INTERPULSE COAX TIP (DISPOSABLE) ×3
HOLDER FOLEY CATH W/STRAP (MISCELLANEOUS) ×3 IMPLANT
HOOD PEEL AWAY FACE SHEILD DIS (HOOD) ×6 IMPLANT
LIQUID BAND (GAUZE/BANDAGES/DRESSINGS) ×4 IMPLANT
MARKER SKIN DUAL TIP RULER LAB (MISCELLANEOUS) ×3 IMPLANT
NDL SPNL 18GX3.5 QUINCKE PK (NEEDLE) ×1 IMPLANT
NEEDLE SPNL 18GX3.5 QUINCKE PK (NEEDLE) ×3 IMPLANT
PACK ANTERIOR HIP CUSTOM (KITS) ×3 IMPLANT
SAW OSC TIP CART 19.5X105X1.3 (SAW) ×3 IMPLANT
SEALER BIPOLAR AQUA 6.0 (INSTRUMENTS) ×3 IMPLANT
SET HNDPC FAN SPRY TIP SCT (DISPOSABLE) ×1 IMPLANT
SOL PREP POV-IOD 4OZ 10% (MISCELLANEOUS) ×3 IMPLANT
SUT ETHIBOND NAB CT1 #1 30IN (SUTURE) ×6 IMPLANT
SUT MNCRL AB 3-0 PS2 18 (SUTURE) ×3 IMPLANT
SUT MON AB 2-0 CT1 36 (SUTURE) ×6 IMPLANT
SUT STRATAFIX 0 PDS 27 VIOLET (SUTURE) ×6
SUT VIC AB 1 CT1 36 (SUTURE) ×3 IMPLANT
SUT VIC AB 2-0 CT1 27 (SUTURE) ×3
SUT VIC AB 2-0 CT1 TAPERPNT 27 (SUTURE) ×1 IMPLANT
SUT VLOC 180 0 24IN GS25 (SUTURE) ×3 IMPLANT
SUTURE STRATFX 0 PDS 27 VIOLET (SUTURE) IMPLANT
SYR 50ML LL SCALE MARK (SYRINGE) ×3 IMPLANT
TRAY FOLEY W/METER SILVER 14FR (SET/KITS/TRAYS/PACK) IMPLANT
TRAY FOLEY W/METER SILVER 16FR (SET/KITS/TRAYS/PACK) ×2 IMPLANT
WATER STERILE IRR 1500ML POUR (IV SOLUTION) ×3 IMPLANT
YANKAUER SUCT BULB TIP 10FT TU (MISCELLANEOUS) ×3 IMPLANT

## 2015-05-23 NOTE — Interval H&P Note (Signed)
History and Physical Interval Note:  05/23/2015 6:58 AM  Steven Cooke  has presented today for surgery, with the diagnosis of RIGHT HIP AVASCULAR NECROSIS  The various methods of treatment have been discussed with the patient and family. After consideration of risks, benefits and other options for treatment, the patient has consented to  Procedure(s): RIGHT TOTAL HIP ARTHROPLASTY ANTERIOR APPROACH (Right) as a surgical intervention .  The patient's history has been reviewed, patient examined, no change in status, stable for surgery.  I have reviewed the patient's chart and labs.  Questions were answered to the patient's satisfaction.     Eldor Conaway, Cloyde ReamsBrian James

## 2015-05-23 NOTE — Anesthesia Postprocedure Evaluation (Signed)
Anesthesia Post Note  Patient: Steven Cooke  Procedure(s) Performed: Procedure(s) (LRB): RIGHT TOTAL HIP ARTHROPLASTY ANTERIOR APPROACH (Right)  Patient location during evaluation: PACU Anesthesia Type: Spinal Level of consciousness: oriented and awake and alert Pain management: pain level controlled Vital Signs Assessment: post-procedure vital signs reviewed and stable Respiratory status: spontaneous breathing, respiratory function stable and patient connected to nasal cannula oxygen Cardiovascular status: blood pressure returned to baseline and stable Postop Assessment: no headache, no backache and spinal receding Anesthetic complications: no    Last Vitals:  Filed Vitals:   05/23/15 1142 05/23/15 1232  BP: 118/74 126/67  Pulse: 67 70  Temp: 36.6 C 36.8 C  Resp: 16 16    Last Pain:  Filed Vitals:   05/23/15 1233  PainSc: 0-No pain                 Steven Cooke

## 2015-05-23 NOTE — Transfer of Care (Signed)
Immediate Anesthesia Transfer of Care Note  Patient: Steven Cooke  Procedure(s) Performed: Procedure(s): RIGHT TOTAL HIP ARTHROPLASTY ANTERIOR APPROACH (Right)  Patient Location: PACU  Anesthesia Type:Spinal  Level of Consciousness:  sedated, patient cooperative and responds to stimulation  Airway & Oxygen Therapy:Patient Spontanous Breathing and Patient connected to face mask oxgen  Post-op Assessment:  Report given to PACU RN and Post -op Vital signs reviewed and stable  Post vital signs:  Reviewed and stable, spinal L1, patient reports no pain.  Last Vitals:  Filed Vitals:   05/23/15 0533  BP: 153/94  Pulse: 78  Temp: 36.8 C  Resp: 18    Complications: No apparent anesthesia complications

## 2015-05-23 NOTE — H&P (View-Only) (Signed)
TOTAL HIP ADMISSION H&P  Patient is admitted for right total hip arthroplasty.  Subjective:  Chief Complaint: right hip pain  HPI: Steven Cooke, 44 y.o. male, has a history of pain and functional disability in the right hip(s) due to AVN and patient has failed non-surgical conservative treatments for greater than 12 weeks to include NSAID's and/or analgesics, flexibility and strengthening excercises, use of assistive devices, weight reduction as appropriate and activity modification.  Onset of symptoms was abrupt starting 1 years ago with rapidlly worsening course since that time.The patient noted no past surgery on the right hip(s).  Patient currently rates pain in the right hip at 10 out of 10 with activity. Patient has night pain, worsening of pain with activity and weight bearing, pain that interfers with activities of daily living and pain with passive range of motion. Patient has evidence of AVN with collapse by imaging studies. This condition presents safety issues increasing the risk of falls.  There is no current active infection.  Patient Active Problem List   Diagnosis Date Noted  . Elevated troponin 08/20/2012  . Encephalopathy acute 08/19/2012  . Alcohol withdrawal (HCC) 08/18/2012  . Atrial fibrillation with RVR (HCC) 08/18/2012  . Hyperglycemia 08/18/2012  . Hypokalemia 08/18/2012  . Nausea and vomiting 08/18/2012  . Alcohol abuse 08/18/2012   Past Medical History  Diagnosis Date  . Alcohol abuse   . Hypertension   . Reflux   . Gout   . Hyperlipidemia   . GERD (gastroesophageal reflux disease)   . Atrial fibrillation     Past Surgical History  Procedure Laterality Date  . No past surgeries       (Not in a hospital admission) No Known Allergies  Social History  Substance Use Topics  . Smoking status: Never Smoker   . Smokeless tobacco: Never Used  . Alcohol Use: Yes     Comment: heavy use    Family History  Problem Relation Age of Onset  . CAD  Father      Review of Systems  Constitutional: Negative.   HENT: Negative.   Eyes: Negative.   Respiratory: Negative.   Cardiovascular: Negative.   Gastrointestinal: Negative.   Genitourinary: Negative.   Musculoskeletal: Positive for joint pain.  Skin: Negative.   Neurological: Negative.   Endo/Heme/Allergies: Negative.   Psychiatric/Behavioral: Negative.     Objective:  Physical Exam  Constitutional: He is oriented to person, place, and time. He appears well-developed and well-nourished.  HENT:  Head: Normocephalic and atraumatic.  Eyes: EOM are normal. Pupils are equal, round, and reactive to light.  Neck: Normal range of motion. Neck supple.  Cardiovascular: Normal rate, regular rhythm and intact distal pulses.   Respiratory: Effort normal and breath sounds normal. No respiratory distress.  GI: Soft. Bowel sounds are normal. He exhibits distension.  Genitourinary:  deferred  Musculoskeletal:       Right hip: He exhibits decreased range of motion.  Neurological: He is alert and oriented to person, place, and time. He has normal reflexes.  Skin: Skin is warm and dry.  Psychiatric: He has a normal mood and affect. His behavior is normal. Judgment and thought content normal.    Vital signs in last 24 hours: @VSRANGES@  Labs:   Estimated body mass index is 34.23 kg/(m^2) as calculated from the following:   Height as of 09/22/12: 5' 6" (1.676 m).   Weight as of 09/22/12: 96.163 kg (212 lb).   Imaging Review Plain radiographs demonstrate AVN of the   right hip with collapse. The bone quality appears to be adequate for age and reported activity level.  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 The treatment options including medical management,  injection therapy, arthroscopy and arthroplasty were discussed at length. The risks and benefits of total hip arthroplasty were presented and reviewed. The risks due to aseptic loosening, infection, stiffness, dislocation/subluxation,  thromboembolic complications and other imponderables were discussed.  The patient acknowledged the explanation, agreed to proceed with the plan and consent was signed. Patient is being admitted for inpatient treatment for surgery, pain control, PT, OT, prophylactic antibiotics, VTE prophylaxis, progressive ambulation and ADL's and discharge planning.The patient is planning to be discharged home with home health services

## 2015-05-23 NOTE — Progress Notes (Signed)
Utilization review completed.  

## 2015-05-23 NOTE — Op Note (Signed)
OPERATIVE REPORT  SURGEON: Samson Frederic, MD   ASSISTANT: Velta Addison, RNFA.  PREOPERATIVE DIAGNOSIS: Avascular necrosis right hip with collapse.   POSTOPERATIVE DIAGNOSIS: Avascular necrosis right hip with collapse.   PROCEDURE: Right total hip arthroplasty, anterior approach.   IMPLANTS: DePuy Tri Lock stem, size 6, hi offset. DePuy Pinnacle Cup, size 60 mm. DePuy Altrx liner, size 36 by 60 mm, +4 neutral. DePuy Biolox ceramic head ball, size 36 + 1.5 mm.  ANESTHESIA:  Spinal  ESTIMATED BLOOD LOSS: 350 mL.  ANTIBIOTICS: 2g ancef.  DRAINS: None.  COMPLICATIONS: None.   CONDITION: PACU - hemodynamically stable.Marland Kitchen   BRIEF CLINICAL NOTE: Steven Cooke is a 44 y.o. male with a long-standing history of Right hip AVN with collapse. After failing conservative management, the patient was indicated for total hip arthroplasty. The risks, benefits, and alternatives to the procedure were explained, and the patient elected to proceed.  PROCEDURE IN DETAIL: Surgical site was marked by myself. Spinal anesthesia was obtained in the pre-op holding area. Once inside the operative room, a foley catheter was inserted. The patient was then positioned on the Hana table. All bony prominences were well padded. The hip was prepped and draped in the normal sterile surgical fashion. A time-out was called verifying side and site of surgery. The patient received IV antibiotics within 60 minutes of beginning the procedure.  The direct anterior approach to the hip was performed through the Hueter interval. Lateral femoral circumflex vessels were treated with the Auqumantys. The anterior capsule was exposed and an inverted T capsulotomy was made.The femoral neck cut was made to the level of the templated cut. A corkscrew was placed into the head and the head was removed. The femoral head was found to have delaminated cartilage. The head was passed to the back table and was  measured.  Acetabular exposure was achieved, and the pulvinar and labrum were excised. Sequental reaming of the acetabulum was then performed up to a size 59 mm reamer. A 60 mm cup was then opened and impacted into place at approximately 40 degrees of abduction and 20 degrees of anteversion. The final polyethylene liner was impacted into place.   I then gained femoral exposure taking care to protect the abductors and greater trochanter. This was performed using standard external rotation, extension, and adduction. The capsule was peeled off the inner aspect of the greater trochanter, taking care to preserve the short external rotators. A cookie cutter was used to enter the femoral canal, and then the femoral canal finder was placed. Sequential broaching was performed up to a size 6. Calcar planer was used on the femoral neck remnant. I placed a hi offset neck and a trial head ball. The hip was reduced. Leg lengths and offset were checked fluoroscopically. The hip was dislocated and trial components were removed. The final implants were placed, and the hip was reduced.  Fluoroscopy was used to confirm component position and leg lengths. At 90 degrees of external rotation and full extension, the hip was stable to an anterior directed force.  The wound was copiously irrigated with a dilute betadine solution followed by normal saline. Marcaine solution was injected into the periarticular soft tissue. The wound was closed in layers using #1 Vicryl and V-Loc for the fascia, 2-0 Vicryl for the subcutaneous fat, 2-0 Monocryl for the deep dermal layer, 3-0 running Monocryl subcuticular stitch, and Dermabond for the skin. Once the glue was fully dried, an Aquacell Ag dressing was applied. The patient was transported  to the recovery room in stable condition. Sponge, needle, and instrument counts were correct at the end of the case x2. The patient tolerated the procedure well and there were no known  complications.

## 2015-05-23 NOTE — Anesthesia Procedure Notes (Signed)
Spinal Patient location during procedure: OR Start time: 05/23/2015 7:22 AM End time: 05/23/2015 7:28 AM Staffing Anesthesiologist: Franne Grip Resident/CRNA: Darlys Gales R Performed by: resident/CRNA  Preanesthetic Checklist Completed: patient identified, site marked, surgical consent, pre-op evaluation, timeout performed, IV checked, risks and benefits discussed and monitors and equipment checked Spinal Block Patient position: sitting Prep: Betadine Patient monitoring: heart rate, continuous pulse ox and blood pressure Location: L3-4 Injection technique: single-shot Needle Needle type: Spinocan  Needle gauge: 22 G Needle length: 10 cm Needle insertion depth: 7.5 cm Assessment Sensory level: T6 Additional Notes Expiration date of kit checked and confirmed. Patient tolerated procedure well, without complications. Lot [id number] exp 2016-09-27

## 2015-05-23 NOTE — Discharge Summary (Signed)
Physician Discharge Summary  Patient ID: Steven Cooke MRN: [id number] DOB/AGE: 1970/07/12 44 y.o.  Admit date: 05/23/2015 Discharge date: 05/24/2015  Admission Diagnoses:  Avascular necrosis of femur head, right [facility])  Discharge Diagnoses:  Principal Problem:   Avascular necrosis of femur head, right (HCC) Active Problems:   Avascular necrosis of bone of right hip [facility])   Past Medical History  Diagnosis Date  . Alcohol abuse   . Hypertension   . Reflux   . Gout   . Hyperlipidemia   . GERD (gastroesophageal reflux disease)   . Atrial fibrillation (HCC)   . Arthritis   . Diabetes mellitus without complication (HCC)     type 2   . Depression   . Anxiety   . Delirium tremens (HCC)   . Dysrhythmia     A fib RVR related to Delirium tremens in 2014    Surgeries: Procedure(s): RIGHT TOTAL HIP ARTHROPLASTY ANTERIOR APPROACH on 05/23/2015   Consultants (if any):    Discharged Condition: Improved  Hospital Course: Steven Cooke is an 44 y.o. male who was admitted 05/23/2015 with a diagnosis of Avascular necrosis of femur head, right (HCC) and went to the operating room on 05/23/2015 and underwent the above named procedures.    He was given perioperative antibiotics:      Anti-infectives    Start     Dose/Rate Route Frequency Ordered Stop   05/23/15 1400  ceFAZolin (ANCEF) IVPB 2 g/50 mL premix     2 g 100 mL/hr over 30 Minutes Intravenous Every 6 hours 05/23/15 1150 05/23/15 2053   05/23/15 0535  ceFAZolin (ANCEF) IVPB 2 g/50 mL premix     2 g 100 mL/hr over 30 Minutes Intravenous On call to O.R. 05/23/15 4098 05/23/15 0735    .  He was given sequential compression devices, early ambulation, and ASA for DVT prophylaxis.  He benefited maximally from the hospital stay and there were no complications.    Recent vital signs:  Filed Vitals:   05/24/15 0548 05/24/15 1055  BP: 142/70 133/78  Pulse: 78 89  Temp: 98.3 F (36.8 C)   Resp: 16      Recent laboratory studies:  Lab Results  Component Value Date   HGB 11.0* 05/24/2015   HGB 14.6 05/16/2015   HGB 12.9* 08/24/2012   Lab Results  Component Value Date   WBC 8.2 05/24/2015   PLT 127* 05/24/2015   Lab Results  Component Value Date   INR 1.02 05/16/2015   Lab Results  Component Value Date   NA 137 05/24/2015   K 4.1 05/24/2015   CL 99* 05/24/2015   CO2 29 05/24/2015   BUN 9 05/24/2015   CREATININE 0.96 05/24/2015   GLUCOSE 122* 05/24/2015    Discharge Medications:     Medication List    TAKE these medications        aspirin EC 325 MG tablet  Take 1 tablet (325 mg total) by mouth 2 (two) times daily after a meal.     busPIRone 10 MG tablet  Commonly known as:  BUSPAR  Take 10 mg by mouth daily.     cetirizine 10 MG tablet  Commonly known as:  ZYRTEC  Take 10 mg by mouth daily as needed for allergies.     DEXILANT 60 MG capsule  Generic drug:  dexlansoprazole  Take 60 mg by mouth daily.     dextromethorphan-guaiFENesin 30-600 MG 12hr tablet  Commonly known as:  MUCINEX DM  Take 1 tablet by mouth 2 (two) times daily.     docusate sodium 100 MG capsule  Commonly known as:  COLACE  Take 1 capsule (100 mg total) by mouth 2 (two) times daily.     fenofibrate 160 MG tablet  Take 160 mg by mouth daily.     HYDROcodone-acetaminophen 5-325 MG tablet  Commonly known as:  NORCO  Take 1-2 tablets by mouth every 4 (four) hours as needed for moderate pain.     LEVEMIR FLEXTOUCH 100 UNIT/ML Pen  Generic drug:  Insulin Detemir  Inject 30 Units into the skin daily.     metFORMIN 1000 MG tablet  Commonly known as:  GLUCOPHAGE  Take 1,000 mg by mouth 2 (two) times daily with a meal.     metoprolol 50 MG tablet  Commonly known as:  LOPRESSOR  Take 1 tablet (50 mg total) by mouth 2 (two) times daily.     ondansetron 8 MG disintegrating tablet  Commonly known as:  ZOFRAN ODT  Take 1 tablet (8 mg total) by mouth every 8 (eight) hours as needed  for nausea or vomiting.     senna 8.6 MG Tabs tablet  Commonly known as:  SENOKOT  Take 2 tablets (17.2 mg total) by mouth at bedtime.     simvastatin 20 MG tablet  Commonly known as:  ZOCOR  Take 20 mg by mouth daily.     traZODone 150 MG tablet  Commonly known as:  DESYREL  Take 150 mg by mouth at bedtime as needed for sleep.        Diagnostic Studies: Dg Pelvis Portable  05/23/2015  CLINICAL DATA:  Status post total hip replacement EXAM: DG C-ARM 1-60 MIN-NO REPORT; PORTABLE PELVIS 1-2 VIEWS COMPARISON:  None. FINDINGS: Frontal view obtained. There is a total hip prosthesis on the right with prosthetic components well-seated. No acute fracture or dislocation is seen. Left hip joint appears unremarkable. IMPRESSION: Status post total hip replacement on the right with prosthetic components appearing well-seated on this single frontal view. No acute fracture or dislocation. Electronically Signed   By: Bretta Bang III M.D.   On: 05/23/2015 11:08   Dg C-arm 1-60 Min-no Report  05/23/2015  CLINICAL DATA: surgery C-ARM 1-60 MINUTES Fluoroscopy was utilized by the requesting physician.  No radiographic interpretation.    Disposition: 06-Home-Health Care Svc  Discharge Instructions    Call MD / Call 911    Complete by:  As directed   If you experience chest pain or shortness of breath, CALL 911 and be transported to the hospital emergency room.  If you develope a fever above 101 F, pus (white drainage) or increased drainage or redness at the wound, or calf pain, call your surgeon's office.     Constipation Prevention    Complete by:  As directed   Drink plenty of fluids.  Prune juice may be helpful.  You may use a stool softener, such as Colace (over the counter) 100 mg twice a day.  Use MiraLax (over the counter) for constipation as needed.     Diet - low sodium heart healthy    Complete by:  As directed      Discharge instructions    Complete by:  As directed   Pick up stool  softner and laxative for home use following surgery while on pain medications. Do not submerge incision under water. May shower but do not remove dressing. Continue to use ice for pain and swelling after surgery.  Do not use any lotions or creams on the incision until instructed by your surgeon.  Total Hip Protocol.  Take full dose 325 mg Aspirin twice a day.  Postoperative Constipation Protocol  Constipation - defined medically as fewer than three stools per week and severe constipation as less than one stool per week.  One of the most common issues patients have following surgery is constipation.  Even if you have a regular bowel pattern at home, your normal regimen is likely to be disrupted due to multiple reasons following surgery.  Combination of anesthesia, postoperative narcotics, change in appetite and fluid intake all can affect your bowels.  In order to avoid complications following surgery, here are some recommendations in order to help you during your recovery period.  Colace (docusate) - Pick up an over-the-counter form of Colace or another stool softener and take twice a day as long as you are requiring postoperative pain medications.  Take with a full glass of water daily.  If you experience loose stools or diarrhea, hold the colace until you stool forms back up.  If your symptoms do not get better within 1 week or if they get worse, check with your doctor.  Dulcolax (bisacodyl) - Pick up over-the-counter and take as directed by the product packaging as needed to assist with the movement of your bowels.  Take with a full glass of water.  Use this product as needed if not relieved by Colace only.   MiraLax (polyethylene glycol) - Pick up over-the-counter to have on hand.  MiraLax is a solution that will increase the amount of water in your bowels to assist with bowel movements.  Take as directed and can mix with a glass of water, juice, soda, coffee, or tea.  Take if you go more than  two days without a movement. Do not use MiraLax more than once per day. Call your doctor if you are still constipated or irregular after using this medication for 7 days in a row.  If you continue to have problems with postoperative constipation, please contact the office for further assistance and recommendations.  If you experience "the worst abdominal pain ever" or develop nausea or vomiting, please contact the office immediatly for further recommendations for treatment.     Do not sit on low chairs, stoools or toilet seats, as it may be difficult to get up from low surfaces    Complete by:  As directed      Driving restrictions    Complete by:  As directed   No driving until released by the physician.     Increase activity slowly as tolerated    Complete by:  As directed      Lifting restrictions    Complete by:  As directed   No lifting until released by the physician.     Patient may shower    Complete by:  As directed   You may shower without a dressing once there is no drainage.  Do not wash over the wound.  If drainage remains, do not shower until drainage stops.     Weight bearing as tolerated    Complete by:  As directed   Laterality:  right  Extremity:  Lower           Follow-up Information    Follow up with Refugio Vandevoorde, Cloyde Reams, MD. Schedule an appointment as soon as possible for a visit in 2 weeks.   Specialty:  Orthopedic Surgery   Why:  For  wound re-check   Contact information:   [address] [place] [address] [phone]       [facility]   Why:  [facility] will contact you concerning start date and time for therapy.   Contact information:   [address] [place] [address] [phone]        Signed: Garnet KoyanagiSwinteck, Ellenora Talton James 05/29/2015, 3:27 PM

## 2015-05-24 LAB — CBC
HEMATOCRIT: 33.4 % — AB (ref 39.0–52.0)
HEMOGLOBIN: 11 g/dL — AB (ref 13.0–17.0)
MCH: 30.5 pg (ref 26.0–34.0)
MCHC: 32.9 g/dL (ref 30.0–36.0)
MCV: 92.5 fL (ref 78.0–100.0)
Platelets: 127 10*3/uL — ABNORMAL LOW (ref 150–400)
RBC: 3.61 MIL/uL — AB (ref 4.22–5.81)
RDW: 11.7 % (ref 11.5–15.5)
WBC: 8.2 10*3/uL (ref 4.0–10.5)

## 2015-05-24 LAB — BASIC METABOLIC PANEL
Anion gap: 9 (ref 5–15)
BUN: 9 mg/dL (ref 6–20)
CHLORIDE: 99 mmol/L — AB (ref 101–111)
CO2: 29 mmol/L (ref 22–32)
CREATININE: 0.96 mg/dL (ref 0.61–1.24)
Calcium: 8.7 mg/dL — ABNORMAL LOW (ref 8.9–10.3)
GFR calc Af Amer: >60 mL/min (ref >60)
GFR calc non Af Amer: >60 mL/min (ref >60)
Glucose, Bld: 122 mg/dL — ABNORMAL HIGH (ref 65–99)
POTASSIUM: 4.1 mmol/L (ref 3.5–5.1)
Sodium: 137 mmol/L (ref 135–145)

## 2015-05-24 LAB — GLUCOSE, CAPILLARY
GLUCOSE-CAPILLARY: 76 mg/dL (ref 65–99)
Glucose-Capillary: 106 mg/dL — ABNORMAL HIGH (ref 65–99)

## 2015-05-24 NOTE — Progress Notes (Signed)
   Subjective: 1 Day Post-Op Procedure(s) (LRB): RIGHT TOTAL HIP ARTHROPLASTY ANTERIOR APPROACH (Right) Patient reports pain as mild.   Patient seen in rounds with Dr. Linna CapriceSwinteck.  Some pain last night but doing okay this morning.  Wants to go home today. Patient is well, but has had some minor complaints of pain in the hip, requiring pain medications Patient is ready to go home following morning therapy.  Objective: Vital signs in last 24 hours: Temp:  [97.6 F (36.4 C)-98.4 F (36.9 C)] 98.3 F (36.8 C) (12/24 0548) Pulse Rate:  [66-82] 78 (12/24 0548) Resp:  [14-19] 16 (12/24 0548) BP: (113-142)/(64-85) 142/70 mmHg (12/24 0548) SpO2:  [93 %-100 %] 98 % (12/24 0548) Weight:  [104.327 kg (230 lb)] 104.327 kg (230 lb) (12/23 1142)  Intake/Output from previous day:  Intake/Output Summary (Last 24 hours) at 05/24/15 0900 Last data filed at 05/24/15 0801  Gross per 24 hour  Intake   4270 ml  Output   2500 ml  Net   1770 ml    Intake/Output this shift: Total I/O In: 240 [P.O.:240] Out: -   Labs:  Recent Labs  05/24/15 0454  HGB 11.0*    Recent Labs  05/24/15 0454  WBC 8.2  RBC 3.61*  HCT 33.4*  PLT 127*    Recent Labs  05/24/15 0454  NA 137  K 4.1  CL 99*  CO2 29  BUN 9  CREATININE 0.96  GLUCOSE 122*  CALCIUM 8.7*   No results for input(s): LABPT, INR in the last 72 hours.  EXAM: General - Patient is Alert, Appropriate and Oriented Extremity - Neurovascular intact Sensation intact distally Dorsiflexion/Plantar flexion intact Dressing - clean, dry, no drainage Motor Function - intact, moving foot and toes well on exam.   Assessment/Plan: 1 Day Post-Op Procedure(s) (LRB): RIGHT TOTAL HIP ARTHROPLASTY ANTERIOR APPROACH (Right) Procedure(s) (LRB): RIGHT TOTAL HIP ARTHROPLASTY ANTERIOR APPROACH (Right) Past Medical History  Diagnosis Date  . Alcohol abuse   . Hypertension   . Reflux   . Gout   . Hyperlipidemia   . GERD (gastroesophageal  reflux disease)   . Atrial fibrillation (HCC)   . Arthritis   . Diabetes mellitus without complication (HCC)     type 2   . Depression   . Anxiety   . Delirium tremens (HCC)   . Dysrhythmia     A fib RVR related to Delirium tremens in 2014   Principal Problem:   Avascular necrosis of femur head, right (HCC) Active Problems:   Avascular necrosis of bone of right hip (HCC)  Estimated body mass index is 37.14 kg/(m^2) as calculated from the following:   Height as of this encounter: 5\' 6"  (1.676 m).   Weight as of this encounter: 104.327 kg (230 lb). Up with therapy Discharge home with home health Diet - Cardiac diet Follow up - in 2 weeks Activity - WBAT Disposition - Home Condition Upon Discharge - Good D/C Meds - See DC Summary DVT Prophylaxis - Aspirin 325 mg twice a day  Avel Peacerew Kostas Marrow, PA-C Orthopaedic Surgery 05/24/2015, 9:00 AM

## 2015-05-24 NOTE — Evaluation (Signed)
Physical Therapy Evaluation Patient Details Name: Steven Cooke MRN: [id number] DOB: 04/24/1971 Today's Date: 05/24/2015   History of Present Illness  R THR  Clinical Impression  Pt s/p R THR presents with decreased R LE strength/ROM and post op pain limiting functional mobility.  Pt should progress well to dc home with family assist and HHPT follow up.    Follow Up Recommendations Home health PT    Equipment Recommendations  Rolling walker with 5" wheels    Recommendations for Other Services OT consult     Precautions / Restrictions Precautions Precautions: Fall Restrictions Weight Bearing Restrictions: No Other Position/Activity Restrictions: wbat      Mobility  Bed Mobility Overal bed mobility: Needs Assistance Bed Mobility: Supine to Sit     Supine to sit: Min assist     General bed mobility comments: cues for sequence and use of L LE to self assist.    Transfers Overall transfer level: Needs assistance Equipment used: Rolling walker (2 wheeled) Transfers: Sit to/from Stand Sit to Stand: Min assist;Min guard         General transfer comment: cues for LE management and use of UEs to self assist  Ambulation/Gait Ambulation/Gait assistance: Min assist;Min guard Ambulation Distance (Feet): 150 Feet Assistive device: Rolling walker (2 wheeled) Gait Pattern/deviations: Step-to pattern;Step-through pattern;Decreased step length - right;Decreased step length - left;Shuffle;Trunk flexed     General Gait Details: cues for posture, position from RW and initial sequence  Stairs            Wheelchair Mobility    Modified Rankin (Stroke Patients Only)       Balance                                             Pertinent Vitals/Pain Pain Assessment: 0-10 Pain Score: 5  Pain Location: R hip/thigh Pain Descriptors / Indicators: Aching;Burning Pain Intervention(s): Limited activity within patient's tolerance;Monitored during  session;Premedicated before session;Ice applied    Home Living Family/patient expects to be discharged to:: Private residence Living Arrangements: Spouse/significant other Available Help at Discharge: Family Type of Home: House Home Access: Stairs to enter Entrance Stairs-Rails: Right Entrance Stairs-Number of Steps: 3 Home Layout: One level Home Equipment: Crutches      Prior Function Level of Independence: Independent               Hand Dominance   Dominant Hand: Right    Extremity/Trunk Assessment   Upper Extremity Assessment: Overall WFL for tasks assessed           Lower Extremity Assessment: RLE deficits/detail RLE Deficits / Details: strength at hip 2+/5 with AAROM at hip to 90 flex and 20 abd    Cervical / Trunk Assessment: Normal  Communication   Communication: No difficulties  Cognition Arousal/Alertness: Awake/alert Behavior During Therapy: WFL for tasks assessed/performed Overall Cognitive Status: Within Functional Limits for tasks assessed                      General Comments      Exercises Total Joint Exercises Ankle Circles/Pumps: AROM;Both;15 reps;Supine Quad Sets: AROM;Both;10 reps;Supine Heel Slides: AAROM;Right;15 reps;Supine Hip ABduction/ADduction: AAROM;Right;15 reps;Supine      Assessment/Plan    PT Assessment Patient needs continued PT services  PT Diagnosis Difficulty walking   PT Problem List Decreased strength;Decreased range of motion;Decreased activity tolerance;Decreased mobility;Pain;Decreased knowledge  of use of DME  PT Treatment Interventions DME instruction;Gait training;Stair training;Functional mobility training;Therapeutic activities;Therapeutic exercise;Patient/family education   PT Goals (Current goals can be found in the Care Plan section) Acute Rehab PT Goals Patient Stated Goal: Regain IND and amb with decreased pain PT Goal Formulation: With patient Time For Goal Achievement: 05/26/15 Potential  to Achieve Goals: Good    Frequency 7X/week   Barriers to discharge        Co-evaluation               End of Session Equipment Utilized During Treatment: Gait belt Activity Tolerance: Patient tolerated treatment well Patient left: in chair;with call bell/phone within reach;with family/visitor present Nurse Communication: Mobility status         Time: 8295-6213 PT Time Calculation (min) (ACUTE ONLY): 26 min   Charges:   PT Evaluation $Initial PT Evaluation Tier I: 1 Procedure PT Treatments $Therapeutic Exercise: 8-22 mins   PT G Codes:        Steven Cooke 2015-06-10, 12:13 PM

## 2015-05-24 NOTE — Care Management (Signed)
Case manager contacted patient concerning Home Health and DME needs. [facility], case manager confirmed with Johny Drillingonna, [facility] Liaison. Rolling walker and 3in1 have been order, CM contacted Trey PaulaJeff, [facility] DME rep.

## 2015-05-24 NOTE — Progress Notes (Addendum)
Pt A&OX4, IVF's infusing without difficulty, [facility] pt tolerating PO fluids. R Hip surgical site with bandage C/D/I  And ice pack to the R hip. Current scheduled and prn pain med schedule manages pain this time. Pt able to dangle. Call light and bed alarm maintained.  Foley discontinued per MD order, pt due to void by 12:20p 05/24/15

## 2015-05-24 NOTE — Progress Notes (Signed)
Assessment unchanged. Pt and wife verbalized understanding of dc instructions through teach back regarding follow up care and when to call the doctor. Scripts given as provided by MD. DME at bedside for pt to take home. Discharged via wc to front entrance to meet awaiting vehicle to carry home. Accompanied by wife and NT.

## 2015-05-24 NOTE — Progress Notes (Signed)
Physical Therapy Treatment Patient Details Name: Steven Cooke MRN: [id number] DOB: 05-07-1971 Today's Date: 05/24/2015    History of Present Illness R THR    PT Comments    Pt progressing well with mobility.  Reviewed therex, car transfers and stairs with pt and spouse  Follow Up Recommendations  Home health PT     Equipment Recommendations  Rolling walker with 5" wheels    Recommendations for Other Services OT consult     Precautions / Restrictions Precautions Precautions: Fall Restrictions Weight Bearing Restrictions: No Other Position/Activity Restrictions: wbat    Mobility  Bed Mobility Overal bed mobility: Needs Assistance Bed Mobility: Sit to Supine       Sit to supine: Supervision   General bed mobility comments: cues for sequence and use of L LE to self assist.    Transfers Overall transfer level: Needs assistance Equipment used: Rolling walker (2 wheeled) Transfers: Sit to/from Stand Sit to Stand: Min guard;Supervision         General transfer comment: cues for LE management and use of UEs to self assist  Ambulation/Gait Ambulation/Gait assistance: Min guard;Supervision Ambulation Distance (Feet): 150 Feet Assistive device: Rolling walker (2 wheeled) Gait Pattern/deviations: Step-to pattern;Step-through pattern;Decreased step length - right;Decreased step length - left;Shuffle;Trunk flexed     General Gait Details: cues for posture, position from RW and initial sequence   Stairs Stairs: Yes Stairs assistance: Min guard Stair Management: One rail Left;Step to pattern;Forwards;With crutches Number of Stairs: 5 General stair comments: cues for sequence and foot/crutch placement  Wheelchair Mobility    Modified Rankin (Stroke Patients Only)       Balance                                    Cognition Arousal/Alertness: Awake/alert Behavior During Therapy: WFL for tasks assessed/performed Overall Cognitive  Status: Within Functional Limits for tasks assessed                      Exercises Total Joint Exercises Ankle Circles/Pumps: AROM;Both;15 reps;Supine Quad Sets: AROM;Both;10 reps;Supine Heel Slides: AAROM;Right;15 reps;Supine Hip ABduction/ADduction: AAROM;Right;15 reps;Supine    General Comments        Pertinent Vitals/Pain Pain Assessment: 0-10 Pain Score: 5  Pain Location: R hip/thigh Pain Descriptors / Indicators: Aching;Sore Pain Intervention(s): Limited activity within patient's tolerance;Monitored during session;Premedicated before session;Ice applied    Home Living                      Prior Function            PT Goals (current goals can now be found in the care plan section) Acute Rehab PT Goals Patient Stated Goal: Regain IND and amb with decreased pain PT Goal Formulation: With patient Time For Goal Achievement: 05/26/15 Potential to Achieve Goals: Good Progress towards PT goals: Progressing toward goals    Frequency  7X/week    PT Plan Current plan remains appropriate    Co-evaluation             End of Session Equipment Utilized During Treatment: Gait belt Activity Tolerance: Patient tolerated treatment well Patient left: with call bell/phone within reach;with family/visitor present;in bed     Time: 4098-1191 PT Time Calculation (min) (ACUTE ONLY): 27 min  Charges:  $Gait Training: 8-22 mins $Therapeutic Exercise: 8-22 mins  G Codes:      Mellony Danziger 05/24/2015, 2:47 PM

## 2015-05-24 NOTE — Discharge Instructions (Signed)
°Dr. Brian Swinteck °Joint Replacement Specialist °[facility] °[address] °[place],  [address] °[phone] ° ° °TOTAL HIP REPLACEMENT POSTOPERATIVE DIRECTIONS ° ° ° °Hip Rehabilitation, Guidelines Following Surgery  ° °WEIGHT BEARING °Weight bearing as tolerated with assist device (walker, cane, etc) as directed, use it as long as suggested by your surgeon or therapist, typically at least 4-6 weeks. ° °The results of a hip operation are greatly improved after range of motion and muscle strengthening exercises. Follow all safety measures which are given to protect your hip. If any of these exercises cause increased pain or swelling in your joint, decrease the amount until you are comfortable again. Then slowly increase the exercises. Call your caregiver if you have problems or questions.  ° °HOME CARE INSTRUCTIONS  °Most of the following instructions are designed to prevent the dislocation of your new hip.  °Remove items at home which could result in a fall. This includes throw rugs or furniture in walking pathways.  °Continue medications as instructed at time of discharge. °· You may have some home medications which will be placed on hold until you complete the course of blood thinner medication. °· You may start showering once you are discharged home. Do not remove your dressing. °Do not put on socks or shoes without following the instructions of your caregivers.   °Sit on chairs with arms. Use the chair arms to help push yourself up when arising.  °Arrange for the use of a toilet seat elevator so you are not sitting low.  °· Walk with walker as instructed.  °You may resume a sexual relationship in one month or when given the OK by your caregiver.  °Use walker as long as suggested by your caregivers.  °You may put full weight on your legs and walk as much as is comfortable. °Avoid periods of inactivity such as sitting longer than an hour when not asleep. This helps prevent  blood clots.  °You may return to work once you are cleared by your surgeon.  °Do not drive a car for 6 weeks or until released by your surgeon.  °Do not drive while taking narcotics.  °Wear elastic stockings for two weeks following surgery during the day but you may remove then at night.  °Make sure you keep all of your appointments after your operation with all of your doctors and caregivers. You should call the office at the above phone number and make an appointment for approximately two weeks after the date of your surgery. °Please pick up a stool softener and laxative for home use as long as you are requiring pain medications. °· ICE to the affected hip every three hours for 30 minutes at a time and then as needed for pain and swelling. Continue to use ice on the hip for pain and swelling from surgery. You may notice swelling that will progress down to the foot and ankle.  This is normal after surgery.  Elevate the leg when you are not up walking on it.   °It is important for you to complete the blood thinner medication as prescribed by your doctor. °· Continue to use the breathing machine which will help keep your temperature down.  It is common for your temperature to cycle up and down following surgery, especially at night when you are not up moving around and exerting yourself.  The breathing machine keeps your lungs expanded and your temperature down. ° °RANGE OF MOTION AND STRENGTHENING EXERCISES  °These exercises are   designed to help you keep full movement of your hip joint. Follow your caregiver's or physical therapist's instructions. Perform all exercises about fifteen times, three times per day or as directed. Exercise both hips, even if you have had only one joint replacement. These exercises can be done on a training (exercise) mat, on the floor, on a table or on a bed. Use whatever works the best and is most comfortable for you. Use music or television while you are exercising so that the exercises  are a pleasant break in your day. This will make your life better with the exercises acting as a break in routine you can look forward to.  Lying on your back, slowly slide your foot toward your buttocks, raising your knee up off the floor. Then slowly slide your foot back down until your leg is straight again.  Lying on your back spread your legs as far apart as you can without causing discomfort.  Lying on your side, raise your upper leg and foot straight up from the floor as far as is comfortable. Slowly lower the leg and repeat.  Lying on your back, tighten up the muscle in the front of your thigh (quadriceps muscles). You can do this by keeping your leg straight and trying to raise your heel off the floor. This helps strengthen the largest muscle supporting your knee.  Lying on your back, tighten up the muscles of your buttocks both with the legs straight and with the knee bent at a comfortable angle while keeping your heel on the floor.   SKILLED REHAB INSTRUCTIONS: If the patient is transferred to a skilled rehab facility following release from the hospital, a list of the current medications will be sent to the facility for the patient to continue.  When discharged from the skilled rehab facility, please have the facility set up the patient's Home Health Physical Therapy prior to being released. Also, the skilled facility will be responsible for providing the patient with their medications at time of release from the facility to include their pain medication and their blood thinner medication. If the patient is still at the rehab facility at time of the two week follow up appointment, the skilled rehab facility will also need to assist the patient in arranging follow up appointment in our office and any transportation needs.  MAKE SURE YOU:  Understand these instructions.  Will watch your condition.  Will get help right away if you are not doing well or get worse.  Pick up stool softner and  laxative for home use following surgery while on pain medications. Do not remove your dressing. The dressing is waterproof--it is OK to take showers. Continue to use ice for pain and swelling after surgery. Do not use any lotions or creams on the incision until instructed by your surgeon. Total Hip Protocol.   Take a full dose 325 mg Aspirin twice a day at home.

## 2015-05-24 NOTE — Progress Notes (Signed)
OT Cancellation Note  Patient Details Name: Steven Cooke MRN: [id number] DOB: 05/28/1971   Cancelled Treatment:    Reason Eval/Treat Not Completed: OT screened, no needs identified, will sign off Pts wife will A pt as needed. Pt has noconcerns regarding ADL activity   Psalm Arman, Metro KungLorraine D 05/24/2015, 11:21 AM

## 2015-05-25 LAB — TYPE AND SCREEN
ABO/RH(D): AB POS
ANTIBODY SCREEN: NEGATIVE

## 2015-05-27 ENCOUNTER — Encounter (HOSPITAL_COMMUNITY): Payer: Self-pay | Admitting: Orthopedic Surgery

## 2015-08-05 ENCOUNTER — Ambulatory Visit: Payer: Self-pay | Admitting: Orthopedic Surgery

## 2015-08-06 ENCOUNTER — Ambulatory Visit: Payer: Self-pay | Admitting: Orthopedic Surgery

## 2015-08-06 NOTE — H&P (Signed)
TOTAL HIP ADMISSION H&P  Patient is admitted for left total hip arthroplasty.  Subjective:  Chief Complaint: left hip pain  HPI: Steven Cooke, 45 y.o. male, has a history of pain and functional disability in the left hip(s) due to AVN and patient has failed non-surgical conservative treatments for greater than 12 weeks to include NSAID's and/or analgesics, flexibility and strengthening excercises, use of assistive devices, weight reduction as appropriate and activity modification.  Onset of symptoms was abrupt starting 1 years ago with rapidlly worsening course since that time.The patient noted no past surgery on the left hip(s).  Patient currently rates pain in the left hip at 10 out of 10 with activity. Patient has night pain, worsening of pain with activity and weight bearing, pain that interfers with activities of daily living and pain with passive range of motion. Patient has evidence of AVN with collapse by imaging studies. This condition presents safety issues increasing the risk of falls.  There is no current active infection.  Patient Active Problem List   Diagnosis Date Noted  . Avascular necrosis of femur head, right (HCC) 05/23/2015  . Avascular necrosis of bone of right hip (HCC) 05/23/2015  . Elevated troponin 08/20/2012  . Encephalopathy acute 08/19/2012  . Alcohol withdrawal (HCC) 08/18/2012  . Atrial fibrillation with RVR (HCC) 08/18/2012  . Hyperglycemia 08/18/2012  . Hypokalemia 08/18/2012  . Nausea and vomiting 08/18/2012  . Alcohol abuse 08/18/2012   Past Medical History  Diagnosis Date  . Alcohol abuse   . Hypertension   . Reflux   . Gout   . Hyperlipidemia   . GERD (gastroesophageal reflux disease)   . Atrial fibrillation (HCC)   . Arthritis   . Diabetes mellitus without complication (HCC)     type 2   . Depression   . Anxiety   . Delirium tremens (HCC)   . Dysrhythmia     A fib RVR related to Delirium tremens in 2014    Past Surgical History   Procedure Laterality Date  . No past surgeries    . Total hip arthroplasty Right 05/23/2015    Procedure: RIGHT TOTAL HIP ARTHROPLASTY ANTERIOR APPROACH;  Surgeon: Deantre Bourdon, MD;  Location: [facility];  Service: Orthopedics;  Laterality: Right;     (Not in a hospital admission) No Known Allergies  Social History  Substance Use Topics  . Smoking status: Never Smoker   . Smokeless tobacco: Never Used  . Alcohol Use: Yes     Comment: heavy use - 1 or 2 beers each night    Family History  Problem Relation Age of Onset  . CAD Father      Review of Systems  Constitutional: Negative.   HENT: Negative.   Eyes: Negative.   Respiratory: Negative.   Cardiovascular: Negative.   Gastrointestinal: Negative.   Genitourinary: Negative.   Musculoskeletal: Positive for joint pain.  Skin: Negative.   Neurological: Negative.   Endo/Heme/Allergies: Negative.   Psychiatric/Behavioral: Negative.     Objective:  Physical Exam  Constitutional: He is oriented to person, place, and time. He appears well-developed and well-nourished.  HENT:  Head: Normocephalic and atraumatic.  Eyes: Conjunctivae are normal. Pupils are equal, round, and reactive to light.  Neck: Normal range of motion. Neck supple.  Cardiovascular: Normal rate and regular rhythm.   Respiratory: Effort normal and breath sounds normal. No respiratory distress.  GI: Soft. Bowel sounds are normal. He exhibits no distension.  Genitourinary:  deferred  Musculoskeletal:         Left hip: He exhibits decreased range of motion.  Neurological: He is alert and oriented to person, place, and time. He has normal reflexes.  Skin: Skin is warm and dry.  Psychiatric: He has a normal mood and affect. His behavior is normal. Judgment and thought content normal.    Vital signs in last 24 hours: @VSRANGES@  Labs:   Estimated body mass index is 37.17 kg/(m^2) as calculated from the following:   Height as of 05/23/15: 5' 6" (1.676 m).    Weight as of 05/16/15: 104.418 kg (230 lb 3.2 oz).   Imaging Review Plain radiographs demonstrate AVN with collapse of the  left hip(s). The bone quality appears to be adequate for age and reported activity level.  Assessment/Plan:  AVN, left hip(s)  The patient history, physical examination, clinical judgement of the provider and imaging studies are consistent with end stage degenerative joint disease of the left hip(s) and total hip arthroplasty is deemed medically necessary. The treatment options including medical management, injection therapy, arthroscopy and arthroplasty were discussed at length. The risks and benefits of total hip arthroplasty were presented and reviewed. The risks due to aseptic loosening, infection, stiffness, dislocation/subluxation,  thromboembolic complications and other imponderables were discussed.  The patient acknowledged the explanation, agreed to proceed with the plan and consent was signed. Patient is being admitted for inpatient treatment for surgery, pain control, PT, OT, prophylactic antibiotics, VTE prophylaxis, progressive ambulation and ADL's and discharge planning.The patient is planning to be discharged home with home health services 

## 2015-08-10 ENCOUNTER — Ambulatory Visit: Payer: Self-pay | Admitting: Orthopedic Surgery

## 2015-08-12 NOTE — Pre-Procedure Instructions (Signed)
Steven Cooke  08/12/2015      [facility] - [place], [place] - [address] [place] [address] Phone: [phone] Fax: [phone]    Your procedure is scheduled on Fri, Mar 17 @ 12:00 PM  Report to [facility] Admitting at 10:00 AM  Call this number if you have problems the morning of surgery:  [phone]   Remember:  Do not eat food or drink liquids after midnight.  Take these medicines the morning of surgery with A SIP OF WATER Buspar(Buspirone),Zyrtec(Cetirizine),Dexilant(Dexlansoprazole),Mucinex(Dextromethorphan),Pain Pill(if needed),Metoprolol(Lopressor),Zofran(Ondansetron-if needed)  How to Manage Your Diabetes Before Surgery   Why is it important to control my blood sugar before and after surgery?   Improving blood sugar levels before and after surgery helps healing and can limit problems.  A way of improving blood sugar control is eating a healthy diet by:  - Eating less sugar and carbohydrates  - Increasing activity/exercise  - Talk with your doctor about reaching your blood sugar goals  High blood sugars (greater than 180 mg/dL) can raise your risk of infections and slow down your recovery so you will need to focus on controlling your diabetes during the weeks before surgery.  Make sure that the doctor who takes care of your diabetes knows about your planned surgery including the date and location.  How do I manage my blood sugars before surgery?   Check your blood sugar at least 4 times a day, 2 days before surgery to make sure that they are not too high or low.   Check your blood sugar the morning of your surgery when you wake up and every 2               hours until you get to the Short-Stay unit.  If your blood sugar is less than 70 mg/dL, you will need to treat for low blood sugar by:  Treat a low blood sugar (less than 70 mg/dL) with 1/2 cup of clear juice (cranberry or apple), 4 glucose tablets, OR  glucose gel.  Recheck blood sugar in 15 minutes after treatment (to make sure it is greater than 70 mg/dL).  If blood sugar is not greater than 70 mg/dL on re-check, call [phone] for further instructions.   Report your blood sugar to the Short-Stay nurse when you get to Short-Stay.  References:  [facility], 2007 "How to Manage your Diabetes Before and After Surgery".  What do I do about my diabetes medications?   Do not take oral diabetes medicines (pills) the morning of surgery.       THE MORNING OF SURGERY, take 15 units of Levemir Insulin.    Do not take other diabetes injectables the day of surgery including Byetta, Victoza, Bydureon, and Trulicity.    If your CBG is greater than 220 mg/dL, you may take 1/2 of your sliding scale (correction) dose of insulin.   For patients with "Insulin Pumps":  Contact your diabetes doctor for specific instructions before surgery.   Decrease basal insulin rates by 20% at midnight the night before surgery.  Note that if your surgery is planned to be longer than 2 hours, your insulin pump will be removed and intravenous (IV) insulin will be started and managed by the nurses and anesthesiologist.  You will be able to restart your insulin pump once you are awake and able to manage it.  Make sure to bring insulin pump supplies to  the hospital with you in case your site needs to be changed.         Do not wear jewelry.  Do not wear lotions, powders, or colognes.  You may wear deodorant.             Men may shave face and neck.  Do not bring valuables to the hospital.  [facility] is not responsible for any belongings or valuables.  Contacts, dentures or bridgework may not be worn into surgery.  Leave your suitcase in the car.  After surgery it may be brought to your room.  For patients admitted to the hospital, discharge time will be determined by your treatment team.  Patients discharged the day of  surgery will not be allowed to drive home.    Special instructions:  [facility] - Preparing for Surgery  Before surgery, you can play an important role.  Because skin is not sterile, your skin needs to be as free of germs as possible.  You can reduce the number of germs on you skin by washing with CHG (chlorahexidine gluconate) soap before surgery.  CHG is an antiseptic cleaner which kills germs and bonds with the skin to continue killing germs even after washing.  Please DO NOT use if you have an allergy to CHG or antibacterial soaps.  If your skin becomes reddened/irritated stop using the CHG and inform your nurse when you arrive at Short Stay.  Do not shave (including legs and underarms) for at least 48 hours prior to the first CHG shower.  You may shave your face.  Please follow these instructions carefully:   1.  Shower with CHG Soap the night before surgery and the                                morning of Surgery.  2.  If you choose to wash your hair, wash your hair first as usual with your       normal shampoo.  3.  After you shampoo, rinse your hair and body thoroughly to remove the                      Shampoo.  4.  Use CHG as you would any other liquid soap.  You can apply chg directly       to the skin and wash gently with scrungie or a clean washcloth.  5.  Apply the CHG Soap to your body ONLY FROM THE NECK DOWN.        Do not use on open wounds or open sores.  Avoid contact with your eyes,       ears, mouth and genitals (private parts).  Wash genitals (private parts)       with your normal soap.  6.  Wash thoroughly, paying special attention to the area where your surgery        will be performed.  7.  Thoroughly rinse your body with warm water from the neck down.  8.  DO NOT shower/wash with your normal soap after using and rinsing off       the CHG Soap.  9.  Pat yourself dry with a clean towel.            10.  Wear clean pajamas.            11.  Place clean sheets on your bed  the night of  your first shower and do not        sleep with pets.  Day of Surgery  Do not apply any lotions/deoderants the morning of surgery.  Please wear clean clothes to the hospital/surgery center.    Please read over the following fact sheets that you were given. Pain Booklet, Coughing and Deep Breathing, Blood Transfusion Information, MRSA Information and Surgical Site Infection Prevention

## 2015-08-13 ENCOUNTER — Encounter (HOSPITAL_COMMUNITY)
Admission: RE | Admit: 2015-08-13 | Discharge: 2015-08-13 | Disposition: A | Discharge status: Home / Self Care | Payer: BLUE CROSS/BLUE SHIELD | Source: Ambulatory Visit | Attending: Orthopedic Surgery | Admitting: Orthopedic Surgery

## 2015-08-13 ENCOUNTER — Encounter (HOSPITAL_COMMUNITY): Payer: Self-pay

## 2015-08-13 HISTORY — DX: Effusion, unspecified joint: M25.40

## 2015-08-13 HISTORY — DX: Insomnia, unspecified: G47.00

## 2015-08-13 HISTORY — DX: Nausea with vomiting, unspecified: R11.2

## 2015-08-13 HISTORY — DX: Pain in unspecified joint: M25.50

## 2015-08-13 HISTORY — DX: Nocturia: R35.1

## 2015-08-13 LAB — CBC
HEMATOCRIT: 43 % (ref 39.0–52.0)
HEMOGLOBIN: 14 g/dL (ref 13.0–17.0)
MCH: 29.6 pg (ref 26.0–34.0)
MCHC: 32.6 g/dL (ref 30.0–36.0)
MCV: 90.9 fL (ref 78.0–100.0)
Platelets: 143 10*3/uL — ABNORMAL LOW (ref 150–400)
RBC: 4.73 MIL/uL (ref 4.22–5.81)
RDW: 13 % (ref 11.5–15.5)
WBC: 2.6 10*3/uL — AB (ref 4.0–10.5)

## 2015-08-13 LAB — COMPREHENSIVE METABOLIC PANEL
ALBUMIN: 4.2 g/dL (ref 3.5–5.0)
ALK PHOS: 22 U/L — AB (ref 38–126)
ALT: 77 U/L — AB (ref 17–63)
AST: 105 U/L — AB (ref 15–41)
Anion gap: 9 (ref 5–15)
BILIRUBIN TOTAL: 0.5 mg/dL (ref 0.3–1.2)
CO2: 26 mmol/L (ref 22–32)
Calcium: 9.1 mg/dL (ref 8.9–10.3)
Chloride: 105 mmol/L (ref 101–111)
Creatinine, Ser: 1.02 mg/dL (ref 0.61–1.24)
GFR calc Af Amer: >60 mL/min (ref >60)
GLUCOSE: 121 mg/dL — AB (ref 65–99)
Potassium: 4 mmol/L (ref 3.5–5.1)
Sodium: 140 mmol/L (ref 135–145)
TOTAL PROTEIN: 7.1 g/dL (ref 6.5–8.1)

## 2015-08-13 LAB — TYPE AND SCREEN
ABO/RH(D): AB POS
ANTIBODY SCREEN: NEGATIVE

## 2015-08-13 LAB — PROTIME-INR
INR: 1.11 (ref 0.00–1.49)
PROTHROMBIN TIME: 14.5 s (ref 11.6–15.2)

## 2015-08-13 LAB — GLUCOSE, CAPILLARY: GLUCOSE-CAPILLARY: 130 mg/dL — AB (ref 65–99)

## 2015-08-13 LAB — URINALYSIS, ROUTINE W REFLEX MICROSCOPIC
BILIRUBIN URINE: NEGATIVE
Glucose, UA: NEGATIVE mg/dL
Hgb urine dipstick: NEGATIVE
KETONES UR: NEGATIVE mg/dL
LEUKOCYTES UA: NEGATIVE
NITRITE: NEGATIVE
PH: 5 (ref 5.0–8.0)
Protein, ur: NEGATIVE mg/dL
SPECIFIC GRAVITY, URINE: 1.017 (ref 1.005–1.030)

## 2015-08-13 LAB — APTT: APTT: 27 s (ref 24–37)

## 2015-08-13 LAB — SURGICAL PCR SCREEN
MRSA, PCR: NEGATIVE
Staphylococcus aureus: NEGATIVE

## 2015-08-13 MED ORDER — CHLORHEXIDINE GLUCONATE 4 % EX LIQD: 60.0000 mL | Freq: Once | CUTANEOUS | Status: DC

## 2015-08-13 NOTE — Progress Notes (Addendum)
Saw a cardiologist > 5 yrs ago but no follow up needed  Medical Md is Dr.Dana Thomasena Edisollins  Echo report in epic from 2014  Stress test > 5 yrs ago  Heart cath denies  EKG in epic from 05-16-15  CXR denies in past yr

## 2015-08-14 LAB — HEMOGLOBIN A1C
Hgb A1c MFr Bld: 6.2 % — ABNORMAL HIGH (ref 4.8–5.6)
Mean Plasma Glucose: 131 mg/dL

## 2015-08-14 LAB — ABO/RH: ABO/RH(D): AB POS

## 2015-08-14 MED ORDER — ACETAMINOPHEN 10 MG/ML IV SOLN
1000.0000 mg | INTRAVENOUS | Status: AC
  Administered 2015-08-15: 1000 mg via INTRAVENOUS
  Filled 2015-08-14: qty 100

## 2015-08-14 MED ORDER — SODIUM CHLORIDE 0.9 % IV SOLN: INTRAVENOUS | Status: DC

## 2015-08-14 MED ORDER — TRANEXAMIC ACID 1000 MG/10ML IV SOLN
1000.0000 mg | INTRAVENOUS | Status: AC
  Administered 2015-08-15: 1000 mg via INTRAVENOUS
  Filled 2015-08-14: qty 10

## 2015-08-14 MED ORDER — CEFAZOLIN SODIUM-DEXTROSE 2-3 GM-% IV SOLR
2.0000 g | INTRAVENOUS | Status: AC
  Administered 2015-08-15: 2 g via INTRAVENOUS
  Filled 2015-08-14: qty 50

## 2015-08-14 NOTE — Progress Notes (Signed)
Anesthesia chart review: Patient is a 45 year old male scheduled for left THA are on 08/15/2015 by Dr. Linna CapriceSwinteck.  History includes non-smoker, HLD, ETOH abuse, afib with RVR in the setting of delirium tremens 07/2012 (peak troponin 1.69), anxiety, depression, HTN, GERD, DM2, insomnia, N/V, right THA 05/23/15 (spinal). PCP is Dr. Irena Reichmannana Collins. He was seen by cardiologists Dr. Myrtis SerKatz and Dr. Swazil[place]Jordan during his 07/2012 admission for afib with RVR with acute ETOH withdrawal. He converted with Cardizem. Afib was felt secondary to ETOH withdraw. Echo and TSH were normal. No further ischemic work-up recommended at that time. Last cardiology visit was with Norma FredricksonLori Gerhardt, NP on 09/22/12. Admits to drinking 1-2 beers/night.  Meds include BuSpar, Zyrtec, Dexilant, fenofibrate, Levemir, metformin, Lopressor, Zocor, trazodone.  PAT Vitals: BP 139/92, HR 97, RR 20, T 36.9C, O2 sat 99%. CBG 130.  05/16/15 EKG: NSR, non-specific T wave abnormality.  08/21/12 Echo: Study Conclusions Left ventricle: The cavity size was normal. Systolic function was normal. The estimated ejection fraction was in the range of 55% to 65%. Wall motion was normal; there were no regional wall motion abnormalities.  Preoperative labs noted. Cr 1.02. Glucose 121. AST 105 (up from 60 05/16/15 and 41 from 08/23/12), ALT 77, stable since 05/16/15. PLT 143. PT/PTT WNL. A1c 6.2. T&S done. ETOH may be contributing to elevated LFTs.   Reviewed above and labs with anesthesiologist Dr. Mable FillB. Judd. No new recommendations. Further evaluation by his assigned anesthesiologist on the day of surgery to discuss the definitive anesthesia plan. His ETOH abuse history places him at increased risk for DTs post-operatively.  Velna Ochsllison Franchot Pollitt, PA-C [facility]Anesthesiology Phone [phone] 08/14/2015 11:55 AM

## 2015-08-15 ENCOUNTER — Inpatient Hospital Stay (HOSPITAL_COMMUNITY): Payer: BLUE CROSS/BLUE SHIELD

## 2015-08-15 ENCOUNTER — Encounter (HOSPITAL_COMMUNITY)
Admission: AD | Disposition: A | Discharge status: Home Health | Payer: Self-pay | Source: Ambulatory Visit | Attending: Orthopedic Surgery

## 2015-08-15 ENCOUNTER — Inpatient Hospital Stay (HOSPITAL_COMMUNITY): Payer: BLUE CROSS/BLUE SHIELD | Admitting: Vascular Surgery

## 2015-08-15 ENCOUNTER — Encounter (HOSPITAL_COMMUNITY): Payer: Self-pay | Admitting: *Deleted

## 2015-08-15 ENCOUNTER — Inpatient Hospital Stay (HOSPITAL_COMMUNITY): Payer: BLUE CROSS/BLUE SHIELD | Admitting: Anesthesiology

## 2015-08-15 ENCOUNTER — Inpatient Hospital Stay (HOSPITAL_COMMUNITY)
Admission: AD | Admit: 2015-08-15 | Discharge: 2015-08-16 | DRG: 470 | Disposition: A | Discharge status: Home Health | Payer: BLUE CROSS/BLUE SHIELD | Source: Ambulatory Visit | Attending: Orthopedic Surgery | Admitting: Orthopedic Surgery

## 2015-08-15 DIAGNOSIS — I4891 Unspecified atrial fibrillation: Secondary | ICD-10-CM | POA: Diagnosis present

## 2015-08-15 DIAGNOSIS — I10 Essential (primary) hypertension: Secondary | ICD-10-CM | POA: Diagnosis present

## 2015-08-15 DIAGNOSIS — K219 Gastro-esophageal reflux disease without esophagitis: Secondary | ICD-10-CM | POA: Diagnosis present

## 2015-08-15 DIAGNOSIS — M87851 Other osteonecrosis, right femur: Secondary | ICD-10-CM | POA: Diagnosis present

## 2015-08-15 DIAGNOSIS — Z7984 Long term (current) use of oral hypoglycemic drugs: Secondary | ICD-10-CM

## 2015-08-15 DIAGNOSIS — M1612 Unilateral primary osteoarthritis, left hip: Secondary | ICD-10-CM | POA: Diagnosis present

## 2015-08-15 DIAGNOSIS — G4700 Insomnia, unspecified: Secondary | ICD-10-CM | POA: Diagnosis present

## 2015-08-15 DIAGNOSIS — Z7982 Long term (current) use of aspirin: Secondary | ICD-10-CM | POA: Diagnosis not present

## 2015-08-15 DIAGNOSIS — F419 Anxiety disorder, unspecified: Secondary | ICD-10-CM | POA: Diagnosis present

## 2015-08-15 DIAGNOSIS — Z96641 Presence of right artificial hip joint: Secondary | ICD-10-CM | POA: Diagnosis present

## 2015-08-15 DIAGNOSIS — E119 Type 2 diabetes mellitus without complications: Secondary | ICD-10-CM | POA: Diagnosis present

## 2015-08-15 DIAGNOSIS — E785 Hyperlipidemia, unspecified: Secondary | ICD-10-CM | POA: Diagnosis present

## 2015-08-15 DIAGNOSIS — M87052 Idiopathic aseptic necrosis of left femur: Secondary | ICD-10-CM | POA: Diagnosis present

## 2015-08-15 DIAGNOSIS — M25752 Osteophyte, left hip: Secondary | ICD-10-CM | POA: Diagnosis present

## 2015-08-15 DIAGNOSIS — Z09 Encounter for follow-up examination after completed treatment for conditions other than malignant neoplasm: Secondary | ICD-10-CM

## 2015-08-15 DIAGNOSIS — Z419 Encounter for procedure for purposes other than remedying health state, unspecified: Secondary | ICD-10-CM

## 2015-08-15 HISTORY — PX: TOTAL HIP ARTHROPLASTY: SHX124

## 2015-08-15 HISTORY — DX: Idiopathic aseptic necrosis of left femur: M87.052

## 2015-08-15 LAB — GLUCOSE, CAPILLARY
GLUCOSE-CAPILLARY: 93 mg/dL (ref 65–99)
GLUCOSE-CAPILLARY: 138 mg/dL — AB (ref 65–99)
Glucose-Capillary: 115 mg/dL — ABNORMAL HIGH (ref 65–99)
Glucose-Capillary: 104 mg/dL — ABNORMAL HIGH (ref 65–99)

## 2015-08-15 SURGERY — ARTHROPLASTY, HIP, TOTAL, ANTERIOR APPROACH
Anesthesia: Spinal | Site: Hip | Laterality: Left

## 2015-08-15 MED ORDER — SENNA 8.6 MG PO TABS
2.0000 | ORAL_TABLET | Freq: Every day | ORAL | Status: DC
  Administered 2015-08-15: 17.2 mg via ORAL
  Filled 2015-08-15: qty 2

## 2015-08-15 MED ORDER — PROMETHAZINE HCL 25 MG/ML IJ SOLN
INTRAMUSCULAR | Status: AC
  Filled 2015-08-15: qty 1

## 2015-08-15 MED ORDER — PHENYLEPHRINE HCL 10 MG/ML IJ SOLN
INTRAMUSCULAR | Status: AC
  Filled 2015-08-15: qty 1

## 2015-08-15 MED ORDER — HYDROMORPHONE HCL 1 MG/ML IJ SOLN
INTRAMUSCULAR | Status: AC
  Administered 2015-08-15: 0.5 mg via INTRAVENOUS
  Filled 2015-08-15: qty 1

## 2015-08-15 MED ORDER — CEFAZOLIN SODIUM-DEXTROSE 2-3 GM-% IV SOLR
2.0000 g | Freq: Four times a day (QID) | INTRAVENOUS | Status: AC
  Administered 2015-08-15 – 2015-08-16 (×2): 2 g via INTRAVENOUS
  Filled 2015-08-15 (×2): qty 50

## 2015-08-15 MED ORDER — ACETAMINOPHEN 650 MG RE SUPP: 650.0000 mg | Freq: Four times a day (QID) | RECTAL | Status: DC | PRN

## 2015-08-15 MED ORDER — BUPIVACAINE-EPINEPHRINE (PF) 0.5% -1:200000 IJ SOLN
INTRAMUSCULAR | Status: DC | PRN
  Administered 2015-08-15: 30 mL via PERINEURAL

## 2015-08-15 MED ORDER — ASPIRIN EC 325 MG PO TBEC
325.0000 mg | DELAYED_RELEASE_TABLET | Freq: Two times a day (BID) | ORAL | Status: DC
  Administered 2015-08-15 – 2015-08-16 (×2): 325 mg via ORAL
  Filled 2015-08-15: qty 1

## 2015-08-15 MED ORDER — MIDAZOLAM HCL 5 MG/5ML IJ SOLN
INTRAMUSCULAR | Status: DC | PRN
  Administered 2015-08-15: 2 mg via INTRAVENOUS

## 2015-08-15 MED ORDER — OXYCODONE-ACETAMINOPHEN 5-325 MG PO TABS
ORAL_TABLET | ORAL | Status: AC
  Administered 2015-08-15: 2
  Filled 2015-08-15: qty 2

## 2015-08-15 MED ORDER — KETOROLAC TROMETHAMINE 15 MG/ML IJ SOLN
INTRAMUSCULAR | Status: AC
  Administered 2015-08-15: 15 mg via INTRAVENOUS
  Filled 2015-08-15: qty 1

## 2015-08-15 MED ORDER — PROPOFOL 500 MG/50ML IV EMUL
INTRAVENOUS | Status: DC | PRN
  Administered 2015-08-15: 50 ug/kg/min via INTRAVENOUS

## 2015-08-15 MED ORDER — HYDROCODONE-ACETAMINOPHEN 5-325 MG PO TABS: 1.0000 | ORAL_TABLET | ORAL | Status: AC | PRN

## 2015-08-15 MED ORDER — FENTANYL CITRATE (PF) 100 MCG/2ML IJ SOLN
INTRAMUSCULAR | Status: AC
  Administered 2015-08-15: 50 ug
  Filled 2015-08-15: qty 2

## 2015-08-15 MED ORDER — PHENYLEPHRINE HCL 10 MG/ML IJ SOLN
INTRAMUSCULAR | Status: DC | PRN
  Administered 2015-08-15 (×2): 40 ug via INTRAVENOUS

## 2015-08-15 MED ORDER — PROPOFOL 10 MG/ML IV BOLUS
INTRAVENOUS | Status: AC
  Filled 2015-08-15: qty 20

## 2015-08-15 MED ORDER — LORATADINE 10 MG PO TABS
10.0000 mg | ORAL_TABLET | Freq: Every day | ORAL | Status: DC
  Administered 2015-08-16: 10 mg via ORAL
  Filled 2015-08-15: qty 1

## 2015-08-15 MED ORDER — SIMVASTATIN 40 MG PO TABS
40.0000 mg | ORAL_TABLET | Freq: Every day | ORAL | Status: DC
  Administered 2015-08-15: 40 mg via ORAL
  Filled 2015-08-15: qty 1

## 2015-08-15 MED ORDER — SUCCINYLCHOLINE CHLORIDE 20 MG/ML IJ SOLN
INTRAMUSCULAR | Status: AC
  Filled 2015-08-15: qty 1

## 2015-08-15 MED ORDER — ROCURONIUM BROMIDE 50 MG/5ML IV SOLN
INTRAVENOUS | Status: AC
  Filled 2015-08-15: qty 1

## 2015-08-15 MED ORDER — SODIUM CHLORIDE 0.9 % IV SOLN
INTRAVENOUS | Status: DC | PRN
  Administered 2015-08-15: 1000 mL via INTRAMUSCULAR

## 2015-08-15 MED ORDER — ONDANSETRON HCL 4 MG/2ML IJ SOLN
4.0000 mg | Freq: Four times a day (QID) | INTRAMUSCULAR | Status: DC | PRN
  Administered 2015-08-15 (×2): 4 mg via INTRAVENOUS
  Filled 2015-08-15: qty 2

## 2015-08-15 MED ORDER — MENTHOL 3 MG MT LOZG: 1.0000 | LOZENGE | OROMUCOSAL | Status: DC | PRN

## 2015-08-15 MED ORDER — PHENYLEPHRINE HCL 10 MG/ML IJ SOLN
10.0000 mg | INTRAVENOUS | Status: DC | PRN
  Administered 2015-08-15: 10 ug/min via INTRAVENOUS

## 2015-08-15 MED ORDER — HYDROCODONE-ACETAMINOPHEN 5-325 MG PO TABS
1.0000 | ORAL_TABLET | ORAL | Status: DC | PRN
  Administered 2015-08-15 – 2015-08-16 (×3): 2 via ORAL
  Administered 2015-08-16: 1 via ORAL
  Filled 2015-08-15 (×4): qty 2

## 2015-08-15 MED ORDER — ACETAMINOPHEN 325 MG PO TABS: 650.0000 mg | ORAL_TABLET | Freq: Four times a day (QID) | ORAL | Status: DC | PRN

## 2015-08-15 MED ORDER — PHENYLEPHRINE 40 MCG/ML (10ML) SYRINGE FOR IV PUSH (FOR BLOOD PRESSURE SUPPORT)
PREFILLED_SYRINGE | INTRAVENOUS | Status: AC
  Filled 2015-08-15: qty 10

## 2015-08-15 MED ORDER — DEXAMETHASONE SODIUM PHOSPHATE 10 MG/ML IJ SOLN
10.0000 mg | Freq: Once | INTRAMUSCULAR | Status: AC
  Administered 2015-08-16: 10 mg via INTRAVENOUS
  Filled 2015-08-15: qty 1

## 2015-08-15 MED ORDER — KETOROLAC TROMETHAMINE 30 MG/ML IJ SOLN
INTRAMUSCULAR | Status: AC
  Filled 2015-08-15: qty 1

## 2015-08-15 MED ORDER — SODIUM CHLORIDE 0.9 % IR SOLN
Status: DC | PRN
  Administered 2015-08-15: 3000 mL

## 2015-08-15 MED ORDER — DOCUSATE SODIUM 100 MG PO CAPS
100.0000 mg | ORAL_CAPSULE | Freq: Two times a day (BID) | ORAL | Status: DC
  Administered 2015-08-15 – 2015-08-16 (×2): 100 mg via ORAL
  Filled 2015-08-15 (×3): qty 1

## 2015-08-15 MED ORDER — LIDOCAINE HCL (CARDIAC) 20 MG/ML IV SOLN: INTRAVENOUS | Status: DC | PRN

## 2015-08-15 MED ORDER — ONDANSETRON HCL 4 MG/2ML IJ SOLN
INTRAMUSCULAR | Status: DC | PRN
  Administered 2015-08-15: 4 mg via INTRAVENOUS

## 2015-08-15 MED ORDER — 0.9 % SODIUM CHLORIDE (POUR BTL) OPTIME
TOPICAL | Status: DC | PRN
  Administered 2015-08-15: 1000 mL

## 2015-08-15 MED ORDER — BUSPIRONE HCL 10 MG PO TABS
10.0000 mg | ORAL_TABLET | Freq: Two times a day (BID) | ORAL | Status: DC
  Administered 2015-08-15 – 2015-08-16 (×2): 10 mg via ORAL
  Filled 2015-08-15 (×2): qty 1

## 2015-08-15 MED ORDER — LACTATED RINGERS IV SOLN
INTRAVENOUS | Status: DC
  Administered 2015-08-15 (×3): via INTRAVENOUS

## 2015-08-15 MED ORDER — HYDROMORPHONE HCL 1 MG/ML IJ SOLN
0.2500 mg | INTRAMUSCULAR | Status: DC | PRN
  Administered 2015-08-15 (×4): 0.5 mg via INTRAVENOUS

## 2015-08-15 MED ORDER — SODIUM CHLORIDE 0.9 % IJ SOLN
INTRAMUSCULAR | Status: DC | PRN
  Administered 2015-08-15: 30 mL via INTRAVENOUS

## 2015-08-15 MED ORDER — ONDANSETRON HCL 4 MG/2ML IJ SOLN
INTRAMUSCULAR | Status: AC
  Administered 2015-08-15: 4 mg via INTRAVENOUS
  Filled 2015-08-15: qty 2

## 2015-08-15 MED ORDER — ASPIRIN EC 325 MG PO TBEC: 325.0000 mg | DELAYED_RELEASE_TABLET | Freq: Two times a day (BID) | ORAL | Status: AC

## 2015-08-15 MED ORDER — FENTANYL CITRATE (PF) 250 MCG/5ML IJ SOLN
INTRAMUSCULAR | Status: AC
  Filled 2015-08-15: qty 5

## 2015-08-15 MED ORDER — TRAZODONE HCL 50 MG PO TABS: 150.0000 mg | ORAL_TABLET | Freq: Every evening | ORAL | Status: DC | PRN

## 2015-08-15 MED ORDER — HYDROMORPHONE HCL 1 MG/ML IJ SOLN
0.5000 mg | INTRAMUSCULAR | Status: DC | PRN
  Administered 2015-08-15 (×2): 0.5 mg via INTRAVENOUS

## 2015-08-15 MED ORDER — PROMETHAZINE HCL 25 MG/ML IJ SOLN
6.2500 mg | INTRAMUSCULAR | Status: DC | PRN
  Filled 2015-08-15: qty 1

## 2015-08-15 MED ORDER — METOPROLOL TARTRATE 50 MG PO TABS
50.0000 mg | ORAL_TABLET | Freq: Two times a day (BID) | ORAL | Status: DC
  Administered 2015-08-15 – 2015-08-16 (×2): 50 mg via ORAL
  Filled 2015-08-15 (×2): qty 1

## 2015-08-15 MED ORDER — BUPIVACAINE-EPINEPHRINE (PF) 0.5% -1:200000 IJ SOLN
INTRAMUSCULAR | Status: AC
  Filled 2015-08-15: qty 30

## 2015-08-15 MED ORDER — PHENOL 1.4 % MT LIQD: 1.0000 | OROMUCOSAL | Status: DC | PRN

## 2015-08-15 MED ORDER — POVIDONE-IODINE 7.5 % EX SOLN
CUTANEOUS | Status: DC | PRN
  Administered 2015-08-15: 17 via TOPICAL

## 2015-08-15 MED ORDER — SODIUM CHLORIDE 0.9 % IV SOLN: INTRAVENOUS | Status: DC

## 2015-08-15 MED ORDER — METOCLOPRAMIDE HCL 5 MG/ML IJ SOLN
5.0000 mg | Freq: Three times a day (TID) | INTRAMUSCULAR | Status: DC | PRN
  Administered 2015-08-16: 10 mg via INTRAVENOUS
  Filled 2015-08-15: qty 2

## 2015-08-15 MED ORDER — PROPOFOL 10 MG/ML IV BOLUS
INTRAVENOUS | Status: DC | PRN
  Administered 2015-08-15: 110 mg via INTRAVENOUS

## 2015-08-15 MED ORDER — INSULIN DETEMIR 100 UNIT/ML ~~LOC~~ SOLN
30.0000 [IU] | Freq: Every day | SUBCUTANEOUS | Status: DC
  Administered 2015-08-16: 30 [IU] via SUBCUTANEOUS
  Filled 2015-08-15: qty 0.3

## 2015-08-15 MED ORDER — MEPERIDINE HCL 25 MG/ML IJ SOLN
6.2500 mg | INTRAMUSCULAR | Status: DC | PRN
Start: 2015-08-15 — End: 2015-08-15

## 2015-08-15 MED ORDER — ONDANSETRON HCL 4 MG PO TABS: 4.0000 mg | ORAL_TABLET | Freq: Four times a day (QID) | ORAL | Status: DC | PRN

## 2015-08-15 MED ORDER — KETOROLAC TROMETHAMINE 30 MG/ML IJ SOLN
INTRAMUSCULAR | Status: DC | PRN
  Administered 2015-08-15: 30 mg via INTRAVENOUS

## 2015-08-15 MED ORDER — ONDANSETRON HCL 4 MG/2ML IJ SOLN
INTRAMUSCULAR | Status: AC
  Filled 2015-08-15: qty 2

## 2015-08-15 MED ORDER — PANTOPRAZOLE SODIUM 40 MG PO TBEC
40.0000 mg | DELAYED_RELEASE_TABLET | Freq: Every day | ORAL | Status: DC
  Administered 2015-08-16: 40 mg via ORAL
  Filled 2015-08-15: qty 1

## 2015-08-15 MED ORDER — HYDROMORPHONE HCL 1 MG/ML IJ SOLN
0.5000 mg | INTRAMUSCULAR | Status: DC | PRN
  Administered 2015-08-16: 0.5 mg via INTRAVENOUS
  Filled 2015-08-15: qty 1

## 2015-08-15 MED ORDER — FENOFIBRATE 160 MG PO TABS
160.0000 mg | ORAL_TABLET | Freq: Every day | ORAL | Status: DC
  Administered 2015-08-16: 160 mg via ORAL
  Filled 2015-08-15: qty 1

## 2015-08-15 MED ORDER — LIDOCAINE HCL (CARDIAC) 20 MG/ML IV SOLN
INTRAVENOUS | Status: DC | PRN
  Administered 2015-08-15: 60 mg via INTRAVENOUS

## 2015-08-15 MED ORDER — MIDAZOLAM HCL 2 MG/2ML IJ SOLN
INTRAMUSCULAR | Status: AC
  Filled 2015-08-15: qty 2

## 2015-08-15 MED ORDER — METFORMIN HCL 500 MG PO TABS
1000.0000 mg | ORAL_TABLET | Freq: Two times a day (BID) | ORAL | Status: DC
  Filled 2015-08-15: qty 2

## 2015-08-15 MED ORDER — METOCLOPRAMIDE HCL 5 MG PO TABS: 5.0000 mg | ORAL_TABLET | Freq: Three times a day (TID) | ORAL | Status: DC | PRN

## 2015-08-15 MED ORDER — BUPIVACAINE IN DEXTROSE 0.75-8.25 % IT SOLN
INTRATHECAL | Status: DC | PRN
  Administered 2015-08-15: 2 mL via INTRATHECAL

## 2015-08-15 MED ORDER — KETOROLAC TROMETHAMINE 15 MG/ML IJ SOLN
15.0000 mg | Freq: Four times a day (QID) | INTRAMUSCULAR | Status: DC
  Administered 2015-08-15 – 2015-08-16 (×3): 15 mg via INTRAVENOUS
  Filled 2015-08-15 (×2): qty 1

## 2015-08-15 MED ORDER — INSULIN DETEMIR 100 UNIT/ML FLEXPEN: 30.0000 [IU] | PEN_INJECTOR | Freq: Every day | SUBCUTANEOUS | Status: DC

## 2015-08-15 MED ORDER — LACTATED RINGERS IV SOLN: INTRAVENOUS | Status: DC

## 2015-08-15 MED ORDER — FENTANYL CITRATE (PF) 100 MCG/2ML IJ SOLN
50.0000 ug | Freq: Once | INTRAMUSCULAR | Status: AC
  Administered 2015-08-15 (×2): 50 ug via INTRAVENOUS
  Administered 2015-08-15: 25 ug via INTRAVENOUS
  Administered 2015-08-15: 50 ug via INTRAVENOUS
  Administered 2015-08-15: 25 ug via INTRAVENOUS

## 2015-08-15 SURGICAL SUPPLY — 57 items
ADH SKN CLS APL DERMABOND .7 (GAUZE/BANDAGES/DRESSINGS) ×1
ALCOHOL ISOPROPYL (RUBBING) (MISCELLANEOUS) ×3 IMPLANT
BLADE SURG ROTATE 9660 (MISCELLANEOUS) IMPLANT
CAPT HIP TOTAL 2 ×2 IMPLANT
CHLORAPREP W/TINT 26ML (MISCELLANEOUS) ×3 IMPLANT
COVER SURGICAL LIGHT HANDLE (MISCELLANEOUS) ×3 IMPLANT
DERMABOND ADVANCED (GAUZE/BANDAGES/DRESSINGS) ×2
DERMABOND ADVANCED .7 DNX12 (GAUZE/BANDAGES/DRESSINGS) ×2 IMPLANT
DRAPE C-ARM 42X72 X-RAY (DRAPES) ×3 IMPLANT
DRAPE IMP U-DRAPE 54X76 (DRAPES) ×6 IMPLANT
DRAPE STERI IOBAN 125X83 (DRAPES) ×3 IMPLANT
DRAPE U-SHAPE 47X51 STRL (DRAPES) ×9 IMPLANT
DRESSING AQUACEL AG SP 3.5X10 (GAUZE/BANDAGES/DRESSINGS) IMPLANT
DRSG AQUACEL AG ADV 3.5X10 (GAUZE/BANDAGES/DRESSINGS) ×3 IMPLANT
DRSG AQUACEL AG SP 3.5X10 (GAUZE/BANDAGES/DRESSINGS) ×3
ELECT BLADE 4.0 EZ CLEAN MEGAD (MISCELLANEOUS) ×3
ELECT REM PT RETURN 9FT ADLT (ELECTROSURGICAL) ×3
ELECTRODE BLDE 4.0 EZ CLN MEGD (MISCELLANEOUS) ×1 IMPLANT
ELECTRODE REM PT RTRN 9FT ADLT (ELECTROSURGICAL) ×1 IMPLANT
EVACUATOR 1/8 PVC DRAIN (DRAIN) IMPLANT
GLOVE BIO SURGEON STRL SZ8.5 (GLOVE) ×6 IMPLANT
GLOVE BIOGEL PI IND STRL 8.5 (GLOVE) ×1 IMPLANT
GLOVE BIOGEL PI INDICATOR 8.5 (GLOVE) ×2
GOWN STRL REUS W/ TWL LRG LVL3 (GOWN DISPOSABLE) ×2 IMPLANT
GOWN STRL REUS W/TWL 2XL LVL3 (GOWN DISPOSABLE) ×3 IMPLANT
GOWN STRL REUS W/TWL LRG LVL3 (GOWN DISPOSABLE) ×9
HANDPIECE INTERPULSE COAX TIP (DISPOSABLE) ×3
HOOD PEEL AWAY FACE SHEILD DIS (HOOD) ×6 IMPLANT
KIT BASIN OR (CUSTOM PROCEDURE TRAY) ×3 IMPLANT
KIT ROOM TURNOVER OR (KITS) ×3 IMPLANT
MANIFOLD NEPTUNE II (INSTRUMENTS) ×3 IMPLANT
MARKER SKIN DUAL TIP RULER LAB (MISCELLANEOUS) ×6 IMPLANT
NDL SPNL 18GX3.5 QUINCKE PK (NEEDLE) ×1 IMPLANT
NEEDLE SPNL 18GX3.5 QUINCKE PK (NEEDLE) ×3 IMPLANT
NS IRRIG 1000ML POUR BTL (IV SOLUTION) ×3 IMPLANT
PACK TOTAL JOINT (CUSTOM PROCEDURE TRAY) ×3 IMPLANT
PACK UNIVERSAL I (CUSTOM PROCEDURE TRAY) ×3 IMPLANT
PAD ARMBOARD 7.5X6 YLW CONV (MISCELLANEOUS) ×6 IMPLANT
SAW OSC TIP CART 19.5X105X1.3 (SAW) ×3 IMPLANT
SEALER BIPOLAR AQUA 6.0 (INSTRUMENTS) ×2 IMPLANT
SET HNDPC FAN SPRY TIP SCT (DISPOSABLE) ×1 IMPLANT
SOLUTION BETADINE 4OZ (MISCELLANEOUS) ×3 IMPLANT
SUCTION FRAZIER HANDLE 10FR (MISCELLANEOUS) ×2
SUCTION TUBE FRAZIER 10FR DISP (MISCELLANEOUS) ×1 IMPLANT
SUT ETHIBOND NAB CT1 #1 30IN (SUTURE) ×6 IMPLANT
SUT MNCRL AB 3-0 PS2 18 (SUTURE) ×3 IMPLANT
SUT MON AB 2-0 CT1 36 (SUTURE) ×3 IMPLANT
SUT VIC AB 1 CT1 27 (SUTURE) ×3
SUT VIC AB 1 CT1 27XBRD ANBCTR (SUTURE) ×1 IMPLANT
SUT VIC AB 2-0 CT1 27 (SUTURE) ×3
SUT VIC AB 2-0 CT1 TAPERPNT 27 (SUTURE) ×1 IMPLANT
SUT VLOC 180 0 24IN GS25 (SUTURE) ×3 IMPLANT
SYR 50ML LL SCALE MARK (SYRINGE) ×3 IMPLANT
TOWEL OR 17X24 6PK STRL BLUE (TOWEL DISPOSABLE) ×3 IMPLANT
TOWEL OR 17X26 10 PK STRL BLUE (TOWEL DISPOSABLE) ×3 IMPLANT
TRAY FOLEY CATH 16FR SILVER (SET/KITS/TRAYS/PACK) ×2 IMPLANT
WATER STERILE IRR 1000ML POUR (IV SOLUTION) ×9 IMPLANT

## 2015-08-15 NOTE — Anesthesia Preprocedure Evaluation (Addendum)
Anesthesia Evaluation  Patient identified by MRN, date of birth, ID band Patient awake    Reviewed: Allergy & Precautions, NPO status , Patient's Chart, lab work & pertinent test results, reviewed documented beta blocker date and time   Airway Mallampati: II  TM Distance: >3 FB Neck ROM: Full    Dental  (+) Teeth Intact   Pulmonary neg pulmonary ROS,    breath sounds clear to auscultation       Cardiovascular hypertension, Pt. on medications and Pt. on home beta blockers + dysrhythmias Atrial Fibrillation  Rhythm:Regular Rate:Normal     Neuro/Psych PSYCHIATRIC DISORDERS Anxiety Depression negative neurological ROS     GI/Hepatic negative GI ROS, Neg liver ROS, GERD  ,  Endo/Other  diabetes, Type 2, Oral Hypoglycemic Agents  Renal/GU negative Renal ROS  negative genitourinary   Musculoskeletal  (+) Arthritis , Osteoarthritis,    Abdominal   Peds negative pediatric ROS (+)  Hematology negative hematology ROS (+)   Anesthesia Other Findings   Reproductive/Obstetrics negative OB ROS                         Lab Results  Component Value Date   WBC 2.6* 08/13/2015   HGB 14.0 08/13/2015   HCT 43.0 08/13/2015   MCV 90.9 08/13/2015   PLT 143* 08/13/2015   Lab Results  Component Value Date   CREATININE 1.02 08/13/2015   BUN <5* 08/13/2015   NA 140 08/13/2015   K 4.0 08/13/2015   CL 105 08/13/2015   CO2 26 08/13/2015   Lab Results  Component Value Date   INR 1.11 08/13/2015   INR 1.02 05/16/2015   05/2015 EKG: normal sinus rhythm.    Anesthesia Physical Anesthesia Plan  ASA: III  Anesthesia Plan: Spinal   Post-op Pain Management:    Induction: Intravenous  Airway Management Planned: Natural Airway and Simple Face Mask  Additional Equipment:   Intra-op Plan:   Post-operative Plan:   Informed Consent: I have reviewed the patients History and Physical, chart, labs and  discussed the procedure including the risks, benefits and alternatives for the proposed anesthesia with the patient or authorized representative who has indicated his/her understanding and acceptance.   Dental advisory given  Plan Discussed with: CRNA  Anesthesia Plan Comments:         Anesthesia Quick Evaluation

## 2015-08-15 NOTE — Progress Notes (Signed)
Patient admitted to 5N21  Noted that he was vomiting , Zofran 4 mg was given as ordered

## 2015-08-15 NOTE — Anesthesia Postprocedure Evaluation (Signed)
Anesthesia Post Note  Patient: Steven KleinChristopher J Cooke  Procedure(s) Performed: Procedure(s) (LRB): LEFT TOTAL HIP ARTHROPLASTY ANTERIOR APPROACH (Left)  Patient location during evaluation: PACU Anesthesia Type: Spinal Level of consciousness: oriented and awake and alert Pain management: pain level controlled Vital Signs Assessment: post-procedure vital signs reviewed and stable Respiratory status: spontaneous breathing, respiratory function stable and patient connected to nasal cannula oxygen Cardiovascular status: blood pressure returned to baseline and stable Postop Assessment: no headache and no backache Anesthetic complications: no    Last Vitals:  Filed Vitals:   08/15/15 1800 08/15/15 1830  BP: 151/103 149/92  Pulse: 102 106  Temp:  36.5 C  Resp: 17 16    Last Pain:  Filed Vitals:   08/15/15 1830  PainSc: 5                  Shelton SilvasKevin D Joyel Chenette

## 2015-08-15 NOTE — Discharge Instructions (Signed)
°Dr. Jacklyn Branan °Joint Replacement Specialist °[facility] °[address] °[place], [place] [address] °[phone] ° ° °TOTAL HIP REPLACEMENT POSTOPERATIVE DIRECTIONS ° ° ° °Hip Rehabilitation, Guidelines Following Surgery  ° °WEIGHT BEARING °Weight bearing as tolerated with assist device (walker, cane, etc) as directed, use it as long as suggested by your surgeon or therapist, typically at least 4-6 weeks. ° °The results of a hip operation are greatly improved after range of motion and muscle strengthening exercises. Follow all safety measures which are given to protect your hip. If any of these exercises cause increased pain or swelling in your joint, decrease the amount until you are comfortable again. Then slowly increase the exercises. Call your caregiver if you have problems or questions.  ° °HOME CARE INSTRUCTIONS  °Most of the following instructions are designed to prevent the dislocation of your new hip.  °Remove items at home which could result in a fall. This includes throw rugs or furniture in walking pathways.  °Continue medications as instructed at time of discharge. °· You may have some home medications which will be placed on hold until you complete the course of blood thinner medication. °· You may start showering once you are discharged home. Do not remove your dressing. °Do not put on socks or shoes without following the instructions of your caregivers.   °Sit on chairs with arms. Use the chair arms to help push yourself up when arising.  °Arrange for the use of a toilet seat elevator so you are not sitting low.  °· Walk with walker as instructed.  °You may resume a sexual relationship in one month or when given the OK by your caregiver.  °Use walker as long as suggested by your caregivers.  °You may put full weight on your legs and walk as much as is comfortable. °Avoid periods of inactivity such as sitting longer than an hour when not asleep. This helps prevent  blood clots.  °You may return to work once you are cleared by your surgeon.  °Do not drive a car for 6 weeks or until released by your surgeon.  °Do not drive while taking narcotics.  °Wear elastic stockings for two weeks following surgery during the day but you may remove then at night.  °Make sure you keep all of your appointments after your operation with all of your doctors and caregivers. You should call the office at the above phone number and make an appointment for approximately two weeks after the date of your surgery. °Please pick up a stool softener and laxative for home use as long as you are requiring pain medications. °· ICE to the affected hip every three hours for 30 minutes at a time and then as needed for pain and swelling. Continue to use ice on the hip for pain and swelling from surgery. You may notice swelling that will progress down to the foot and ankle.  This is normal after surgery.  Elevate the leg when you are not up walking on it.   °It is important for you to complete the blood thinner medication as prescribed by your doctor. °· Continue to use the breathing machine which will help keep your temperature down.  It is common for your temperature to cycle up and down following surgery, especially at night when you are not up moving around and exerting yourself.  The breathing machine keeps your lungs expanded and your temperature down. ° °RANGE OF MOTION AND STRENGTHENING EXERCISES  °These exercises are   designed to help you keep full movement of your hip joint. Follow your caregiver's or physical therapist's instructions. Perform all exercises about fifteen times, three times per day or as directed. Exercise both hips, even if you have had only one joint replacement. These exercises can be done on a training (exercise) mat, on the floor, on a table or on a bed. Use whatever works the best and is most comfortable for you. Use music or television while you are exercising so that the exercises  are a pleasant break in your day. This will make your life better with the exercises acting as a break in routine you can look forward to.  °Lying on your back, slowly slide your foot toward your buttocks, raising your knee up off the floor. Then slowly slide your foot back down until your leg is straight again.  °Lying on your back spread your legs as far apart as you can without causing discomfort.  °Lying on your side, raise your upper leg and foot straight up from the floor as far as is comfortable. Slowly lower the leg and repeat.  °Lying on your back, tighten up the muscle in the front of your thigh (quadriceps muscles). You can do this by keeping your leg straight and trying to raise your heel off the floor. This helps strengthen the largest muscle supporting your knee.  °Lying on your back, tighten up the muscles of your buttocks both with the legs straight and with the knee bent at a comfortable angle while keeping your heel on the floor.  ° °SKILLED REHAB INSTRUCTIONS: °If the patient is transferred to a skilled rehab facility following release from the hospital, a list of the current medications will be sent to the facility for the patient to continue.  When discharged from the skilled rehab facility, please have the facility set up the patient's Home Health Physical Therapy prior to being released. Also, the skilled facility will be responsible for providing the patient with their medications at time of release from the facility to include their pain medication and their blood thinner medication. If the patient is still at the rehab facility at time of the two week follow up appointment, the skilled rehab facility will also need to assist the patient in arranging follow up appointment in our office and any transportation needs. ° °MAKE SURE YOU:  °Understand these instructions.  °Will watch your condition.  °Will get help right away if you are not doing well or get worse. ° °Pick up stool softner and  laxative for home use following surgery while on pain medications. °Do not remove your dressing. °The dressing is waterproof--it is OK to take showers. °Continue to use ice for pain and swelling after surgery. °Do not use any lotions or creams on the incision until instructed by your surgeon. °Total Hip Protocol. ° ° °

## 2015-08-15 NOTE — Transfer of Care (Signed)
Immediate Anesthesia Transfer of Care Note  Patient: Steven Cooke  Procedure(s) Performed: Procedure(s): LEFT TOTAL HIP ARTHROPLASTY ANTERIOR APPROACH (Left)  Patient Location: PACU  Anesthesia Type:MAC and Spinal  Level of Consciousness: awake, alert  and oriented  Airway & Oxygen Therapy: Patient Spontanous Breathing and Patient connected to nasal cannula oxygen  Post-op Assessment: Report given to RN and Post -op Vital signs reviewed and stable  Post vital signs: stable  Last Vitals:  Filed Vitals:   08/15/15 1125 08/15/15 1601  BP:  122/94  Pulse: 95 82  Temp:  36.4 C  Resp:      Complications: No apparent anesthesia complications

## 2015-08-15 NOTE — H&P (View-Only) (Signed)
TOTAL HIP ADMISSION H&P  Patient is admitted for left total hip arthroplasty.  Subjective:  Chief Complaint: left hip pain  HPI: Steven Cooke, 45 y.o. male, has a history of pain and functional disability in the left hip(s) due to AVN and patient has failed non-surgical conservative treatments for greater than 12 weeks to include NSAID's and/or analgesics, flexibility and strengthening excercises, use of assistive devices, weight reduction as appropriate and activity modification.  Onset of symptoms was abrupt starting 1 years ago with rapidlly worsening course since that time.The patient noted no past surgery on the left hip(s).  Patient currently rates pain in the left hip at 10 out of 10 with activity. Patient has night pain, worsening of pain with activity and weight bearing, pain that interfers with activities of daily living and pain with passive range of motion. Patient has evidence of AVN with collapse by imaging studies. This condition presents safety issues increasing the risk of falls.  There is no current active infection.  Patient Active Problem List   Diagnosis Date Noted  . Avascular necrosis of femur head, right (HCC) 05/23/2015  . Avascular necrosis of bone of right hip (HCC) 05/23/2015  . Elevated troponin 08/20/2012  . Encephalopathy acute 08/19/2012  . Alcohol withdrawal (HCC) 08/18/2012  . Atrial fibrillation with RVR (HCC) 08/18/2012  . Hyperglycemia 08/18/2012  . Hypokalemia 08/18/2012  . Nausea and vomiting 08/18/2012  . Alcohol abuse 08/18/2012   Past Medical History  Diagnosis Date  . Alcohol abuse   . Hypertension   . Reflux   . Gout   . Hyperlipidemia   . GERD (gastroesophageal reflux disease)   . Atrial fibrillation (HCC)   . Arthritis   . Diabetes mellitus without complication (HCC)     type 2   . Depression   . Anxiety   . Delirium tremens (HCC)   . Dysrhythmia     A fib RVR related to Delirium tremens in 2014    Past Surgical History   Procedure Laterality Date  . No past surgeries    . Total hip arthroplasty Right 05/23/2015    Procedure: RIGHT TOTAL HIP ARTHROPLASTY ANTERIOR APPROACH;  Surgeon: Samson Frederic, MD;  Location: [facility];  Service: Orthopedics;  Laterality: Right;     (Not in a hospital admission) No Known Allergies  Social History  Substance Use Topics  . Smoking status: Never Smoker   . Smokeless tobacco: Never Used  . Alcohol Use: Yes     Comment: heavy use - 1 or 2 beers each night    Family History  Problem Relation Age of Onset  . CAD Father      Review of Systems  Constitutional: Negative.   HENT: Negative.   Eyes: Negative.   Respiratory: Negative.   Cardiovascular: Negative.   Gastrointestinal: Negative.   Genitourinary: Negative.   Musculoskeletal: Positive for joint pain.  Skin: Negative.   Neurological: Negative.   Endo/Heme/Allergies: Negative.   Psychiatric/Behavioral: Negative.     Objective:  Physical Exam  Constitutional: He is oriented to person, place, and time. He appears well-developed and well-nourished.  HENT:  Head: Normocephalic and atraumatic.  Eyes: Conjunctivae are normal. Pupils are equal, round, and reactive to light.  Neck: Normal range of motion. Neck supple.  Cardiovascular: Normal rate and regular rhythm.   Respiratory: Effort normal and breath sounds normal. No respiratory distress.  GI: Soft. Bowel sounds are normal. He exhibits no distension.  Genitourinary:  deferred  Musculoskeletal:  Left hip: He exhibits decreased range of motion.  Neurological: He is alert and oriented to person, place, and time. He has normal reflexes.  Skin: Skin is warm and dry.  Psychiatric: He has a normal mood and affect. His behavior is normal. Judgment and thought content normal.    Vital signs in last 24 hours: @VSRANGES @  Labs:   Estimated body mass index is 37.17 kg/(m^2) as calculated from the following:   Height as of 05/23/15: 5\' 6"  (1.676 m).    Weight as of 05/16/15: 104.418 kg (230 lb 3.2 oz).   Imaging Review Plain radiographs demonstrate AVN with collapse of the  left hip(s). The bone quality appears to be adequate for age and reported activity level.  Assessment/Plan:  AVN, left hip(s)  The patient history, physical examination, clinical judgement of the provider and imaging studies are consistent with end stage degenerative joint disease of the left hip(s) and total hip arthroplasty is deemed medically necessary. The treatment options including medical management, injection therapy, arthroscopy and arthroplasty were discussed at length. The risks and benefits of total hip arthroplasty were presented and reviewed. The risks due to aseptic loosening, infection, stiffness, dislocation/subluxation,  thromboembolic complications and other imponderables were discussed.  The patient acknowledged the explanation, agreed to proceed with the plan and consent was signed. Patient is being admitted for inpatient treatment for surgery, pain control, PT, OT, prophylactic antibiotics, VTE prophylaxis, progressive ambulation and ADL's and discharge planning.The patient is planning to be discharged home with home health services

## 2015-08-15 NOTE — Interval H&P Note (Signed)
History and Physical Interval Note:  08/15/2015 12:54 PM  Steven Cooke  has presented today for surgery, with the diagnosis of AVN left hip with collapse  The various methods of treatment have been discussed with the patient and family. After consideration of risks, benefits and other options for treatment, the patient has consented to  Procedure(s): LEFT TOTAL HIP ARTHROPLASTY ANTERIOR APPROACH (Left) as a surgical intervention .  The patient's history has been reviewed, patient examined, no change in status, stable for surgery.  I have reviewed the patient's chart and labs.  Questions were answered to the patient's satisfaction.     Steven Cooke, Cloyde ReamsBrian James

## 2015-08-15 NOTE — Anesthesia Procedure Notes (Addendum)
Spinal Patient location during procedure: OR Start time: 08/15/2015 1:17 PM End time: 08/15/2015 1:22 PM Staffing Anesthesiologist: Suella Broad D Performed by: anesthesiologist  Preanesthetic Checklist Completed: patient identified, site marked, surgical consent, pre-op evaluation, timeout performed, IV checked, risks and benefits discussed and monitors and equipment checked Spinal Block Patient position: sitting Prep: Betadine Patient monitoring: heart rate, continuous pulse ox, blood pressure and cardiac monitor Approach: midline Location: L3-4 Injection technique: single-shot Needle Needle type: Whitacre and Introducer  Needle gauge: 24 G Needle length: 9 cm Additional Notes Negative paresthesia. Negative blood return. Positive free-flowing CSF. Expiration date of kit checked and confirmed. Patient tolerated procedure well, without complications.    Procedure Name: MAC Date/Time: 08/15/2015 1:53 PM Performed by: Lavell Luster Pre-anesthesia Checklist: Patient identified, Emergency Drugs available, Suction available, Patient being monitored and Timeout performed Patient Re-evaluated:Patient Re-evaluated prior to inductionOxygen Delivery Method: Nasal cannula Preoxygenation: Pre-oxygenation with 100% oxygen Intubation Type: IV induction Placement Confirmation: positive ETCO2 and breath sounds checked- equal and bilateral Dental Injury: Teeth and Oropharynx as per pre-operative assessment

## 2015-08-15 NOTE — Op Note (Signed)
OPERATIVE REPORT  SURGEON: Samson Frederic, MD   ASSISTANT: April Green, RNFA.  PREOPERATIVE DIAGNOSIS: Left hip avascular necrosis.   POSTOPERATIVE DIAGNOSIS: Left hip avascular necrosis.   PROCEDURE: Left total hip arthroplasty, anterior approach.   IMPLANTS: DePuy Tri Lock stem, size 5, hi offset. DePuy Pinnacle Cup, size 56 mm. DePuy Altrx liner, size 36 by 56 mm, + 4 neutral. DePuy Biolox ceramic head ball, size 36 + 1.5 mm.  ANESTHESIA:  Spinal  ESTIMATED BLOOD LOSS: 350 mL.  ANTIBIOTICS: 2 g ancef.  DRAINS: None.  COMPLICATIONS: None.   CONDITION: PACU - hemodynamically stable.Marland Kitchen   BRIEF CLINICAL NOTE: Steven Cooke is a 45 y.o. male with a long-standing history of Left hip AVN. After failing conservative management, the patient was indicated for total hip arthroplasty. The risks, benefits, and alternatives to the procedure were explained, and the patient elected to proceed.  PROCEDURE IN DETAIL: Surgical site was marked by myself. Spinal anesthesia was obtained in the pre-op holding area. Once inside the operative room, a foley catheter was inserted. The patient was then positioned on the Hana table. All bony prominences were well padded. The hip was prepped and draped in the normal sterile surgical fashion. A time-out was called verifying side and site of surgery. The patient received IV antibiotics within 60 minutes of beginning the procedure.  The direct anterior approach to the hip was performed through the Hueter interval. Lateral femoral circumflex vessels were treated with the Auqumantys. The anterior capsule was exposed and an inverted T capsulotomy was made.The femoral neck cut was made to the level of the templated cut. A corkscrew was placed into the head and the head was removed. The femoral head was found to have eburnated bone. The head was passed to the back table and was measured.  Acetabular exposure was achieved, and the pulvinar and  labrum were excised. Sequental reaming of the acetabulum was then performed up to a size 55 mm reamer. A 56 mm cup was then opened and impacted into place at approximately 40 degrees of abduction and 20 degrees of anteversion. The final polyethylene liner was impacted into place and acetabular osteophytes were removed.   I then gained femoral exposure taking care to protect the abductors and greater trochanter. This was performed using standard external rotation, extension, and adduction. The capsule was peeled off the inner aspect of the greater trochanter, taking care to preserve the short external rotators. A cookie cutter was used to enter the femoral canal, and then the femoral canal finder was placed. Sequential broaching was performed up to a size 5. Calcar planer was used on the femoral neck remnant. I placed a hi offset neck and a trial head ball. The hip was reduced. Leg lengths and offset were checked fluoroscopically. The hip was dislocated and trial components were removed. The final implants were placed, and the hip was reduced.  Fluoroscopy was used to confirm component position and leg lengths. At 90 degrees of external rotation and full extension, the hip was stable to an anterior directed force.  The wound was copiously irrigated with a dilute betadine solution followed by normal saline. Marcaine solution was injected into the periarticular soft tissue. The wound was closed in layers using #1 Vicryl and V-Loc for the fascia, 2-0 Vicryl for the subcutaneous fat, 2-0 Monocryl for the deep dermal layer, 3-0 running Monocryl subcuticular stitch, and Dermabond for the skin. Once the glue was fully dried, an Aquacell Ag dressing was applied. The patient was  transported to the recovery room in stable condition. Sponge, needle, and instrument counts were correct at the end of the case x2. The patient tolerated the procedure well and there were no known complications.

## 2015-08-15 NOTE — Discharge Summary (Signed)
Physician Discharge Summary  Patient ID: Steven Cooke MRN: [id number] DOB/AGE: 44/09/1970 44 y.o.  Admit date: 08/15/2015 Discharge date: 08/17/2015  Admission Diagnoses:  Avascular necrosis of femur head, left [facility](HCC)  Discharge Diagnoses:  Principal Problem:   Avascular necrosis of femur head, left [facility](HCC)   Past Medical History  Diagnosis Date  . Gout     hx of but not on meds  . Hyperlipidemia     takes Fenofibrate and Simvastatin daily   . Arthritis   . Dysrhythmia     A fib RVR related to Delirium tremens in 2014  . Depression     takes Wellbutrin daily  . Anxiety     takes Buspar daily  . GERD (gastroesophageal reflux disease)     takes Dexilant daily  . Hypertension     takes Metoprolol daily  . Diabetes mellitus without complication (HCC)     takes Metformin and Levemir daily  . Insomnia     takes Trazodone nightly  . Nausea & vomiting     takes Zofran as needed  . Joint pain   . Joint swelling   . Nocturia     Surgeries: Procedure(s): LEFT TOTAL HIP ARTHROPLASTY ANTERIOR APPROACH on 08/15/2015   Consultants (if any):    Discharged Condition: Improved  Hospital Course: Steven Cooke is an 45 y.o. male who was admitted 08/15/2015 with a diagnosis of Avascular necrosis of femur head, left (HCC) and went to the operating room on 08/15/2015 and underwent the above named procedures.    He was given perioperative antibiotics:      Anti-infectives    Start     Dose/Rate Route Frequency Ordered Stop   08/15/15 2000  ceFAZolin (ANCEF) IVPB 2 g/50 mL premix     2 g 100 mL/hr over 30 Minutes Intravenous Every 6 hours 08/15/15 1820 08/16/15 0220   08/15/15 1130  ceFAZolin (ANCEF) IVPB 2 g/50 mL premix     2 g 100 mL/hr over 30 Minutes Intravenous To [facility]Stay [facility] 08/14/15 0948 08/15/15 1413    .  He was given sequential compression devices, early ambulation, and ASA for DVT prophylaxis.  He benefited maximally from the hospital stay and there  were no complications.    Recent vital signs:  Filed Vitals:   08/16/15 0142 08/16/15 0445  BP: 144/100 149/98  Pulse: 106 104  Temp: 98.7 F (37.1 C) 98.9 F (37.2 C)  Resp: 16 16    Recent laboratory studies:  Lab Results  Component Value Date   HGB 10.9* 08/16/2015   HGB 14.0 08/13/2015   HGB 11.0* 05/24/2015   Lab Results  Component Value Date   WBC 5.3 08/16/2015   PLT 145* 08/16/2015   Lab Results  Component Value Date   INR 1.11 08/13/2015   Lab Results  Component Value Date   NA 137 08/16/2015   K 3.7 08/16/2015   CL 98* 08/16/2015   CO2 25 08/16/2015   BUN 6 08/16/2015   CREATININE 0.99 08/16/2015   GLUCOSE 97 08/16/2015    Discharge Medications:     Medication List    TAKE these medications        aspirin EC 325 MG tablet  Take 1 tablet (325 mg total) by mouth 2 (two) times daily after a meal.     busPIRone 10 MG tablet  Commonly known as:  BUSPAR  Take 10 mg by mouth 2 (two) times daily.     cetirizine 10 MG tablet  Commonly known as:  ZYRTEC  Take 10 mg by mouth daily as needed for allergies.     DEXILANT 60 MG capsule  Generic drug:  dexlansoprazole  Take 60 mg by mouth daily.     dextromethorphan-guaiFENesin 30-600 MG 12hr tablet  Commonly known as:  MUCINEX DM  Take 1 tablet by mouth 2 (two) times daily.     docusate sodium 100 MG capsule  Commonly known as:  COLACE  Take 1 capsule (100 mg total) by mouth 2 (two) times daily.     fenofibrate 160 MG tablet  Take 160 mg by mouth daily.     HYDROcodone-acetaminophen 5-325 MG tablet  Commonly known as:  NORCO  Take 1-2 tablets by mouth every 4 (four) hours as needed for moderate pain.     LEVEMIR FLEXTOUCH 100 UNIT/ML Pen  Generic drug:  Insulin Detemir  Inject 30 Units into the skin daily.     metFORMIN 1000 MG tablet  Commonly known as:  GLUCOPHAGE  Take 1,000 mg by mouth 2 (two) times daily with a meal.     metoprolol 50 MG tablet  Commonly known as:  LOPRESSOR  Take  1 tablet (50 mg total) by mouth 2 (two) times daily.     ondansetron 8 MG disintegrating tablet  Commonly known as:  ZOFRAN ODT  Take 1 tablet (8 mg total) by mouth every 8 (eight) hours as needed for nausea or vomiting.     senna 8.6 MG Tabs tablet  Commonly known as:  SENOKOT  Take 2 tablets (17.2 mg total) by mouth at bedtime.     simvastatin 40 MG tablet  Commonly known as:  ZOCOR  Take 40 mg by mouth daily.     simvastatin 20 MG tablet  Commonly known as:  ZOCOR  Take 20 mg by mouth daily.     traZODone 150 MG tablet  Commonly known as:  DESYREL  Take 150 mg by mouth at bedtime as needed for sleep.        Diagnostic Studies: Dg Pelvis Portable  08/15/2015  CLINICAL DATA:  Left hip replacement postop EXAM: PORTABLE PELVIS 1-2 VIEWS COMPARISON:  Pelvis plain film dated 05/23/2015. FINDINGS: Patient is status post total left hip arthroplasty. Hardware appears intact and appropriately positioned. The previous right hip arthroplasty hardware appears stable in alignment. No acute or suspicious osseous lesion. Soft tissues about the pelvis and hips are unremarkable. IMPRESSION: Bilateral total hip arthroplasty hardware which appear intact and appropriately positioned. No evidence of surgical complicating feature. Electronically Signed   By: Bary Richard M.D.   On: 08/15/2015 17:02   Dg Hip Operative Unilat W Or W/o Pelvis Left  08/15/2015  CLINICAL DATA:  Status post left total hip replacement EXAM: OPERATIVE LEFT HIP   1 VIEW TECHNIQUE: Fluoroscopic spot image(s) were submitted for interpretation post-operatively. FLUOROSCOPY TIME:  0 minutes 17 seconds; 2 submitted images COMPARISON:  None. FINDINGS: Frontal view shows a total hip replacement on the left with prosthetic components appearing well-seated. No fracture or dislocation. There is also a total hip prosthesis on the right. IMPRESSION: The prosthetic components on the left are well seated on the single frontal view. No fracture  or dislocation. Electronically Signed   By: Bretta Bang III M.D.   On: 08/15/2015 15:40    Disposition: 01-Home or Self Care    Follow-up Information    Follow up with Alex Leahy, Cloyde Reams, MD. Schedule an appointment as soon as possible for a visit in 2  weeks.   Specialty:  Orthopedic Surgery   Why:  For wound re-check   Contact information:   [address] [place] [address] [phone]       Follow up with [facility].   Why:  someone from [facility] will contact you concerning start date and time for therapy   Contact information:   [address] [place] [address] [phone]        Signed: Garnet Koyanagi 08/17/2015, 9:03 PM

## 2015-08-16 LAB — BASIC METABOLIC PANEL
Anion gap: 14 (ref 5–15)
BUN: 6 mg/dL (ref 6–20)
CHLORIDE: 98 mmol/L — AB (ref 101–111)
CO2: 25 mmol/L (ref 22–32)
CREATININE: 0.99 mg/dL (ref 0.61–1.24)
Calcium: 8.3 mg/dL — ABNORMAL LOW (ref 8.9–10.3)
GFR calc Af Amer: >60 mL/min (ref >60)
GFR calc non Af Amer: >60 mL/min (ref >60)
Glucose, Bld: 97 mg/dL (ref 65–99)
Potassium: 3.7 mmol/L (ref 3.5–5.1)
SODIUM: 137 mmol/L (ref 135–145)

## 2015-08-16 LAB — CBC
HEMATOCRIT: 34.1 % — AB (ref 39.0–52.0)
HEMOGLOBIN: 10.9 g/dL — AB (ref 13.0–17.0)
MCH: 28.9 pg (ref 26.0–34.0)
MCHC: 32 g/dL (ref 30.0–36.0)
MCV: 90.5 fL (ref 78.0–100.0)
Platelets: 145 10*3/uL — ABNORMAL LOW (ref 150–400)
RBC: 3.77 MIL/uL — ABNORMAL LOW (ref 4.22–5.81)
RDW: 13.2 % (ref 11.5–15.5)
WBC: 5.3 10*3/uL (ref 4.0–10.5)

## 2015-08-16 LAB — GLUCOSE, CAPILLARY
Glucose-Capillary: 96 mg/dL (ref 65–99)
Glucose-Capillary: 126 mg/dL — ABNORMAL HIGH (ref 65–99)

## 2015-08-16 NOTE — Progress Notes (Signed)
Steven Cooke  MRN: [id number] DOB/Age: August 25, 1970 45 y.o. Physician: Swinteck Procedure: Procedure(s) (LRB): LEFT TOTAL HIP ARTHROPLASTY ANTERIOR APPROACH (Left)     Subjective: Very sore in muscles but different pain than preop. Has met DC goals  Vital Signs Temp:  [97.5 F (36.4 C)-98.9 F (37.2 C)] 98.9 F (37.2 C) (03/18 0445) Pulse Rate:  [81-117] 104 (03/18 0445) Resp:  [14-24] 16 (03/18 0445) BP: (115-151)/(81-103) 149/98 mmHg (03/18 0445) SpO2:  [95 %-100 %] 100 % (03/18 0445)  Lab Results  Recent Labs  08/16/15 0607  WBC 5.3  HGB 10.9*  HCT 34.1*  PLT 145*   BMET  Recent Labs  08/16/15 0607  NA 137  K 3.7  CL 98*  CO2 25  GLUCOSE 97  BUN 6  CREATININE 0.99  CALCIUM 8.3*   INR  Date Value Ref Range Status  08/13/2015 1.11 0.00 - 1.49 Final     Exam Dressing dry NVI        Plan DC home  Instructions and rx in chart  Steven Vrooman PA-C  08/16/2015, 11:10 AM Contact # [phone]

## 2015-08-16 NOTE — Evaluation (Signed)
Physical Therapy Evaluation Patient Details Name: Steven Cooke MRN: [id number] DOB: 01/11/1971 Today's Date: 08/16/2015   History of Present Illness  45yo admitted for LTHA direct anterior. PMHx: R THA, AVN, DM, GERD, ETOH abuse  Clinical Impression  Pt moving well but limited by Left hip pain with movement. Pt familiar with mobility, DME and HEP from prior R THA. Pt with decreased strength, transfers, gait and mobility who will benefit from acute therapy to maximize mobility, function, gait and independence to decrease burden of care.     Follow Up Recommendations Home health PT    Equipment Recommendations  None recommended by PT    Recommendations for Other Services       Precautions / Restrictions Precautions Precautions: None      Mobility  Bed Mobility Overal bed mobility: Modified Independent                Transfers Overall transfer level: Needs assistance   Transfers: Sit to/from Stand Sit to Stand: Supervision         General transfer comment: cues for hand placement, safety and sequence  Ambulation/Gait Ambulation/Gait assistance: Supervision Ambulation Distance (Feet): 75 Feet Assistive device: Rolling walker (2 wheeled) Gait Pattern/deviations: Step-to pattern;Decreased stride length;Decreased weight shift to left;Decreased stance time - left;Antalgic   Gait velocity interpretation: Below normal speed for age/gender General Gait Details: pt with decreased stance on left secondary to pain. pt with education for sequence  Stairs            Wheelchair Mobility    Modified Rankin (Stroke Patients Only)       Balance                                             Pertinent Vitals/Pain Pain Assessment: 0-10 Pain Score: 3  Pain Location: left hip Pain Descriptors / Indicators: Aching Pain Intervention(s): Limited activity within patient's tolerance;Premedicated before session;Repositioned;Ice applied    Home  Living Family/patient expects to be discharged to:: Private residence Living Arrangements: Spouse/significant other Available Help at Discharge: Family;Available 24 hours/day Type of Home: House Home Access: Stairs to enter Entrance Stairs-Rails: Right Entrance Stairs-Number of Steps: 3 Home Layout: One level Home Equipment: Walker - 2 wheels;Crutches;Bedside commode      Prior Function Level of Independence: Independent               Hand Dominance        Extremity/Trunk Assessment   Upper Extremity Assessment: Overall WFL for tasks assessed           Lower Extremity Assessment: LLE deficits/detail   LLE Deficits / Details: decreased strength and ROM as expected post op  Cervical / Trunk Assessment: Normal  Communication   Communication: No difficulties  Cognition Arousal/Alertness: Awake/alert Behavior During Therapy: WFL for tasks assessed/performed Overall Cognitive Status: Within Functional Limits for tasks assessed                      General Comments      Exercises Total Joint Exercises Hip ABduction/ADduction: AROM;Left;10 reps;Standing Knee Flexion: AROM;Left;10 reps;Standing Marching in Standing: AROM;Left;10 reps;Standing Standing Hip Extension: AROM;Left;10 reps;Standing      Assessment/Plan    PT Assessment Patient needs continued PT services  PT Diagnosis Difficulty walking;Acute pain   PT Problem List Decreased strength;Decreased activity tolerance;Decreased mobility;Pain;Decreased knowledge of use of DME  PT  Treatment Interventions DME instruction;Gait training;Stair training;Functional mobility training;Therapeutic activities;Therapeutic exercise;Patient/family education   PT Goals (Current goals can be found in the Care Plan section) Acute Rehab PT Goals Patient Stated Goal: return to work PT Goal Formulation: With patient/family Time For Goal Achievement: 08/23/15 Potential to Achieve Goals: Good    Frequency 7X/week    Barriers to discharge        Co-evaluation               End of Session   Activity Tolerance: Patient tolerated treatment well Patient left: in chair;with call bell/phone within reach;with family/visitor present Nurse Communication: Mobility status         Time: 4401-0272 PT Time Calculation (min) (ACUTE ONLY): 18 min   Charges:   PT Evaluation $PT Eval Low Complexity: 1 Procedure     PT G CodesDelorse Lek 08/16/2015, 9:27 AM Delaney Meigs, PT [phone]

## 2015-08-16 NOTE — Care Management Note (Signed)
Case Management Note  Patient Details  Name: Steven Cooke MRN: [id number] Date of Birth: 07/01/1970  Subjective/Objective:     45 yr old gentleman s/p left total hip arthroplasty.               Action/Plan: Case manager spoke with patient and wife concerning home health and DME needs. [facility] no changes. He has rolling walker and 3in1 at home, will have family support at discharge.     Expected Discharge Date:     08/16/15             Expected Discharge Plan:  Home w Home Health Services  In-House Referral:     Discharge planning Services  CM Consult  Post Acute Care Choice:  Home Health Choice offered to:     DME Arranged:  N/A DME Agency:  NA  HH Arranged:  PT HH Agency:     Status of Service:  Completed, signed off  Medicare Important Message Given:    Date Medicare IM Given:    Medicare IM give by:    Date Additional Medicare IM Given:    Additional Medicare Important Message give by:     If discussed at Long Length of Stay Meetings, dates discussed:    Additional Comments:  Durenda GuthrieBrady, Shaylene Paganelli Naomi, RN 08/16/2015, 11:17 AM

## 2015-08-16 NOTE — Progress Notes (Signed)
Physical Therapy Progress Note Pt moving well able to complete increased hall ambulation, stairs and HEP limited only by pain. Pt and wife educated for transfers and HEP with all questions answered. Pt safe for D/C home.   08/16/15 1200  PT Visit Information  Last PT Received On 08/16/15  Assistance Needed +1  History of Present Illness 45yo admitted for LTHA direct anterior. PMHx: R THA, AVN, DM, GERD, ETOH abuse  PT Time Calculation  PT Start Time (ACUTE ONLY) 1138  PT Stop Time (ACUTE ONLY) 1150  PT Time Calculation (min) (ACUTE ONLY) 12 min  Precautions  Precautions None  Pain Assessment  Pain Score 8  Pain Location left hip  Pain Descriptors / Indicators Aching  Pain Intervention(s) Limited activity within patient's tolerance;Monitored during session;Repositioned;Patient requesting pain meds-RN notified;Ice applied  Cognition  Arousal/Alertness Awake/alert  Behavior During Therapy WFL for tasks assessed/performed  Overall Cognitive Status Within Functional Limits for tasks assessed  Transfers  Overall transfer level Needs assistance  Equipment used None  Transfers Sit to/from Stand  Sit to Stand Supervision  General transfer comment cues for hand placement, safety and sequence  Ambulation/Gait  Ambulation/Gait assistance Supervision  Ambulation Distance (Feet) 200 Feet  Assistive device Rolling walker (2 wheeled)  Gait Pattern/deviations Step-to pattern;Trunk flexed;Antalgic  General Gait Details cues for upright posture, decreased speed, shoulder depression  Gait velocity interpretation Below normal speed for age/gender  Stairs Yes  Stairs assistance Supervision  Stair Management One rail Left;Sideways;Step to pattern  Number of Stairs 3  General stair comments cues for sequence with pt able to demonstrate  Exercises  Exercises Total Joint  Total Joint Exercises  Hip ABduction/ADduction AROM;Left;10 reps;Standing  Knee Flexion AROM;Left;10 reps;Standing  Marching  in Standing AROM;Left;10 reps;Standing  Standing Hip Extension AROM;Left;10 reps;Standing  Long Arc Quad AROM;Left;10 reps;Seated  PT - End of Session  Activity Tolerance Patient tolerated treatment well  Patient left in chair;with call bell/phone within reach;with family/visitor present  Nurse Communication Mobility status  PT - Assessment/Plan  PT Plan Current plan remains appropriate  Follow Up Recommendations Home health PT  PT Goal Progression  Progress towards PT goals Progressing toward goals  PT General Charges  $$ ACUTE PT VISIT 1 Procedure  PT Treatments  $Gait Training 8-22 mins   Delaney MeigsMaija Tabor Lissette Schenk, PT [phone]

## 2015-08-16 NOTE — Care Management (Signed)
Utilization review completed. Lafayette Dunlevy, RN Case Manager [phone]. 

## 2015-08-18 ENCOUNTER — Encounter (HOSPITAL_COMMUNITY): Payer: Self-pay | Admitting: Orthopedic Surgery

## 2015-11-26 DIAGNOSIS — M109 Gout, unspecified: Secondary | ICD-10-CM | POA: Insufficient documentation

## 2015-11-26 DIAGNOSIS — R7989 Other specified abnormal findings of blood chemistry: Secondary | ICD-10-CM

## 2015-11-26 DIAGNOSIS — E119 Type 2 diabetes mellitus without complications: Secondary | ICD-10-CM

## 2015-11-26 DIAGNOSIS — R945 Abnormal results of liver function studies: Secondary | ICD-10-CM

## 2015-11-26 DIAGNOSIS — F329 Major depressive disorder, single episode, unspecified: Secondary | ICD-10-CM | POA: Insufficient documentation

## 2015-11-26 DIAGNOSIS — I10 Essential (primary) hypertension: Secondary | ICD-10-CM | POA: Insufficient documentation

## 2015-11-26 DIAGNOSIS — F1020 Alcohol dependence, uncomplicated: Secondary | ICD-10-CM

## 2015-11-26 DIAGNOSIS — F419 Anxiety disorder, unspecified: Secondary | ICD-10-CM

## 2015-11-26 DIAGNOSIS — M47816 Spondylosis without myelopathy or radiculopathy, lumbar region: Secondary | ICD-10-CM

## 2015-11-26 DIAGNOSIS — E7849 Other hyperlipidemia: Secondary | ICD-10-CM

## 2015-11-26 DIAGNOSIS — M6208 Separation of muscle (nontraumatic), other site: Secondary | ICD-10-CM | POA: Insufficient documentation

## 2015-11-26 DIAGNOSIS — F32A Depression, unspecified: Secondary | ICD-10-CM | POA: Insufficient documentation

## 2015-11-26 DIAGNOSIS — K219 Gastro-esophageal reflux disease without esophagitis: Secondary | ICD-10-CM | POA: Insufficient documentation

## 2015-11-26 HISTORY — DX: Alcohol dependence, uncomplicated: F10.20

## 2015-11-26 HISTORY — DX: Depression, unspecified: F32.A

## 2015-11-26 HISTORY — DX: Separation of muscle (nontraumatic), other site: M62.08

## 2015-11-26 HISTORY — DX: Anxiety disorder, unspecified: F41.9

## 2015-11-26 HISTORY — DX: Gastro-esophageal reflux disease without esophagitis: K21.9

## 2015-11-26 HISTORY — DX: Other specified abnormal findings of blood chemistry: R79.89

## 2015-11-26 HISTORY — DX: Type 2 diabetes mellitus without complications: E11.9

## 2015-11-26 HISTORY — DX: Spondylosis without myelopathy or radiculopathy, lumbar region: M47.816

## 2015-11-26 HISTORY — DX: Other hyperlipidemia: E78.49

## 2016-12-27 DIAGNOSIS — G4700 Insomnia, unspecified: Secondary | ICD-10-CM

## 2016-12-27 HISTORY — DX: Insomnia, unspecified: G47.00

## 2017-03-03 DIAGNOSIS — R079 Chest pain, unspecified: Secondary | ICD-10-CM

## 2017-03-03 HISTORY — DX: Chest pain, unspecified: R07.9

## 2017-03-10 DIAGNOSIS — D649 Anemia, unspecified: Secondary | ICD-10-CM | POA: Insufficient documentation

## 2017-03-10 HISTORY — DX: Anemia, unspecified: D64.9

## 2017-03-21 ENCOUNTER — Encounter: Payer: Self-pay | Admitting: *Deleted

## 2017-03-22 ENCOUNTER — Other Ambulatory Visit: Payer: Self-pay | Admitting: *Deleted

## 2017-03-22 IMAGING — DX DG PORTABLE PELVIS
1 series · 1 of 1 positions shown · non-contrast
Comparison: None.

CLINICAL DATA: Status post total hip replacement

EXAM:
DG C-ARM 1-60 MIN-NO REPORT; PORTABLE PELVIS 1-2 VIEWS

[pelvis ap]
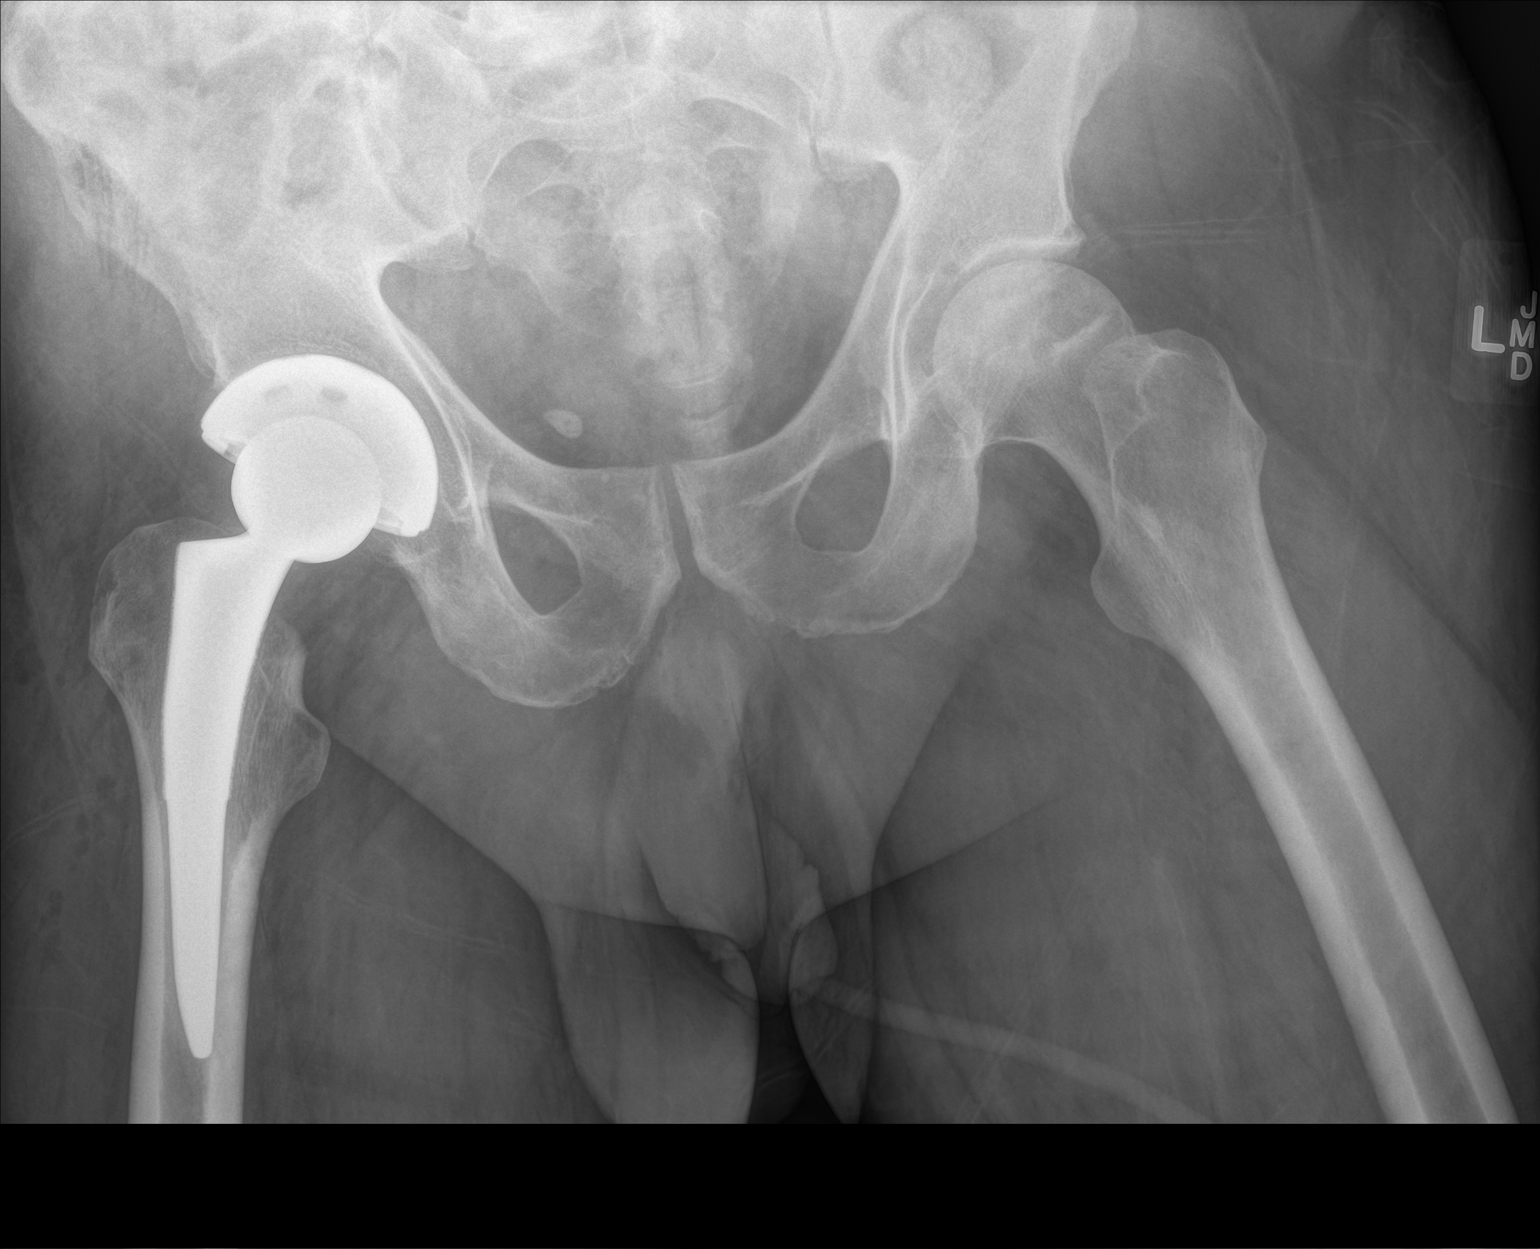

[1 of 1 positions shown; findings below may reference images not displayed]

FINDINGS: Frontal view obtained. There is a total hip prosthesis on the right
with prosthetic components well-seated. No acute fracture or
dislocation is seen. Left hip joint appears unremarkable.
IMPRESSION: Status post total hip replacement on the right with prosthetic
components appearing well-seated on this single frontal view. No
acute fracture or dislocation.

## 2017-03-23 NOTE — Progress Notes (Deleted)
Cardiology Office Note:    Date:  03/23/2017   ID:  Steven Cooke, DOB 12/01/1970, MRN [id number]  PCP:  Irena Reichmannollins, Dana, DO  Cardiologist:  Norman HerrlichBrian Munley, MD   Referring MD: Irena Reichmannollins, Dana, DO  ASSESSMENT:    No diagnosis found. PLAN:    In order of problems listed above:  1. ***  Next appointment   Medication Adjustments/Labs and Tests Ordered: Current medicines are reviewed at length with the patient today.  Concerns regarding medicines are outlined above.  No orders of the defined types were placed in this encounter.  No orders of the defined types were placed in this encounter.    No chief complaint on file. ***  History of Present Illness:    Steven Cooke is a 46 y.o. male with hypertension, hyperlipidemia and T2 DMwho is being seen today for the evaluation of chest pain at the request of Irena ReichmannCollins, Dana, DO.   Past Medical History:  Diagnosis Date  . Acid reflux 11/26/2015  . Alcohol abuse 08/18/2012  . Alcohol addiction (HCC) 11/26/2015  . Alcohol withdrawal (HCC) 08/18/2012  . Anemia 03/10/2017  . Anxiety    takes Buspar daily  . Anxiety and depression 11/26/2015  . Arthritis   . Atrial fibrillation with RVR (HCC) 08/18/2012  . Avascular necrosis of bone of right hip (HCC) 05/23/2015  . 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 Avascular necrosis of femur head, right (HCC) 05/23/2015  . Chest pain 03/03/2017  . Degenerative arthritis of lumbar spine 11/26/2015  . Depression    takes Wellbutrin daily  . Diabetes mellitus (HCC) 11/26/2015  . Diabetes mellitus without complication (HCC)    takes Metformin and Levemir daily  . Diastasis recti 11/26/2015  . Dysrhythmia    A fib RVR related to Delirium tremens in 2014  . Elevated liver function tests 11/26/2015  . Elevated troponin 08/20/2012  . Encephalopathy acute 08/19/2012  . 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 GERD (gastroesophageal reflux disease)    takes Dexilant  daily  . Gout    hx of but not on meds  . Hyperglycemia 08/18/2012  . Hyperlipidemia    takes Fenofibrate and Simvastatin daily   . Hypertension    takes Metoprolol daily  . Hypokalemia 08/18/2012  . Insomnia    takes Trazodone nightly  . 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 Joint pain   . Joint swelling   . Nausea & vomiting    takes Zofran as needed  . Nausea and vomiting 08/18/2012  . Nocturia     Past Surgical History:  Procedure Laterality Date  . COLONOSCOPY    . ESOPHAGOGASTRODUODENOSCOPY    . TOTAL HIP ARTHROPLASTY Right 05/23/2015   Procedure: RIGHT TOTAL HIP ARTHROPLASTY ANTERIOR APPROACH;  Surgeon: Samson FredericBrian Swinteck, MD;  Location: [facility];  Service: Orthopedics;  Laterality: Right;  . TOTAL HIP ARTHROPLASTY Left 08/15/2015   Procedure: LEFT TOTAL HIP ARTHROPLASTY ANTERIOR APPROACH;  Surgeon: Samson FredericBrian Swinteck, MD;  Location: [facility] OR;  Service: Orthopedics;  Laterality: Left;    Current Medications: No outpatient prescriptions have been marked as taking for the 03/24/17 encounter (Appointment) with Baldo DaubMunley, Brian J, MD.     Allergies:   Patient has no known allergies.   Social History   Social History  . Marital status: Married    Spouse name: N/A  . Number of children: N/A  . Years of education: N/A   Social History Main Topics  . Smoking status: Never Smoker  . Smokeless  tobacco: Never Used  . Alcohol use No     Comment: heavy use - 1 or 2 beers each night  . Drug use: No  . Sexual activity: Not Currently   Other Topics Concern  . Not on file   Social History Narrative  . No narrative on file     Family History: The patient's ***family history includes CAD in his father; Heart disease in his father; Kidney cancer in his father; Skin cancer in his father.  ROS:   ROS Please see the history of present illness.    *** All other systems reviewed and are negative.  EKGs/Labs/Other Studies Reviewed:    The following studies were reviewed today: *** EKG  03/03/17: Sinus bradycardia Otherwise normal ECG No previous ECGs available Confirmed by [facility], DR. F.R. ([id number]) on 03/03/2017 10:52:34 AM  EKG:  EKG is *** ordered today.  The ekg ordered today demonstrates ***  Recent Labs: 03/03/17 CMP normal except Glu 145 No results found for requested labs within last 8760 hours.  Recent Lipid Panel 12/23/16 Chol 249, HDL 52, LDL  161 No results found for: CHOL, TRIG, HDL, CHOLHDL, VLDL, LDLCALC, LDLDIRECT  Physical Exam:    VS:  There were no vitals taken for this visit.    Wt Readings from Last 3 Encounters:  08/15/15 232 lb (105.2 kg)  08/13/15 232 lb 5.8 oz (105.4 kg)  05/23/15 230 lb (104.3 kg)     GEN: *** Well nourished, well developed in no acute distress HEENT: Normal NECK: No JVD; No carotid bruits LYMPHATICS: No lymphadenopathy CARDIAC: ***RRR, no murmurs, rubs, gallops RESPIRATORY:  Clear to auscultation without rales, wheezing or rhonchi  ABDOMEN: Soft, non-tender, non-distended MUSCULOSKELETAL:  No edema; No deformity  SKIN: Warm and dry NEUROLOGIC:  Alert and oriented x 3 PSYCHIATRIC:  Normal affect     Signed, Norman Herrlich, MD  03/23/2017 1:41 PM    [facility]

## 2017-03-24 ENCOUNTER — Ambulatory Visit: Payer: BLUE CROSS/BLUE SHIELD | Admitting: Cardiology

## 2017-04-04 ENCOUNTER — Other Ambulatory Visit: Payer: Self-pay | Admitting: *Deleted

## 2017-04-04 ENCOUNTER — Ambulatory Visit: Payer: BLUE CROSS/BLUE SHIELD | Admitting: Cardiology

## 2017-04-04 NOTE — Progress Notes (Deleted)
Cardiology Office Note:    Date:  04/04/2017   ID:  Stark Klein, DOB 1970-11-24, MRN [id number]  PCP:  Irena Reichmann, DO  Cardiologist:  Norman Herrlich, MD   Referring MD: Irena Reichmann, DO  ASSESSMENT:    1. Chest pain, unspecified type   2. Essential hypertension   3. Familial combined hyperlipidemia   4. Atrial fibrillation with RVR (HCC)    PLAN:    In order of problems listed above:  1. ***  Next appointment   Medication Adjustments/Labs and Tests Ordered: Current medicines are reviewed at length with the patient today.  Concerns regarding medicines are outlined above.  No orders of the defined types were placed in this encounter.  No orders of the defined types were placed in this encounter.    No chief complaint on file. ***  History of Present Illness:    Steven Cooke is a 46 y.o. male with a history of atrial fibrillation, hypertension, T2 DM  and hyperlipidemiawho is being seen today for the evaluation of chest pain at the request of Nodal, Joline Salt., PA-C  [address]  [place]  [place], [place] [address]  [phone]  [phone] (Fax) He he an ED visit [facility] in 2012 with chest pain associated with cocaine.  Past Medical History:  Diagnosis Date  . Acid reflux 11/26/2015  . Alcohol abuse 08/18/2012  . Alcohol addiction (HCC) 11/26/2015  . Alcohol withdrawal (HCC) 08/18/2012  . Anemia 03/10/2017  . Anxiety    takes Buspar daily  . Anxiety and depression 11/26/2015  . Arthritis   . Atrial fibrillation with RVR (HCC) 08/18/2012  . Avascular necrosis of bone of right hip (HCC) 05/23/2015  . 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 Avascular necrosis of femur head, right (HCC) 05/23/2015  . Chest pain 03/03/2017  . Degenerative arthritis of lumbar spine 11/26/2015  . Depression    takes Wellbutrin daily  . Diabetes mellitus (HCC) 11/26/2015  . Diabetes mellitus without complication (HCC)    takes Metformin and  Levemir daily  . Diastasis recti 11/26/2015  . Dysrhythmia    A fib RVR related to Delirium tremens in 2014  . Elevated liver function tests 11/26/2015  . Elevated troponin 08/20/2012  . Encephalopathy acute 08/19/2012  . 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 GERD (gastroesophageal reflux disease)    takes Dexilant daily  . Gout    hx of but not on meds  . Hyperglycemia 08/18/2012  . Hyperlipidemia    takes Fenofibrate and Simvastatin daily   . Hypertension    takes Metoprolol daily  . Hypokalemia 08/18/2012  . Insomnia    takes Trazodone nightly  . 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 Joint pain   . Joint swelling   . Nausea & vomiting    takes Zofran as needed  . Nausea and vomiting 08/18/2012  . Nocturia     Past Surgical History:  Procedure Laterality Date  . COLONOSCOPY    . ESOPHAGOGASTRODUODENOSCOPY      Current Medications: No outpatient medications have been marked as taking for the 04/04/17 encounter (Appointment) with Baldo Daub, MD.     Allergies:   Patient has no known allergies.   Social History   Socioeconomic History  . Marital status: Married    Spouse name: Not on file  . Number of children: Not on file  . Years of education: Not on file  . Highest education level: Not on file  Social  Needs  . Financial resource strain: Not on file  . Food insecurity - worry: Not on file  . Food insecurity - inability: Not on file  . Transportation needs - medical: Not on file  . Transportation needs - non-medical: Not on file  Occupational History  . Not on file  Tobacco Use  . Smoking status: Never Smoker  . Smokeless tobacco: Never Used  Substance and Sexual Activity  . Alcohol use: No    Comment: heavy use - 1 or 2 beers each night  . Drug use: No  . Sexual activity: Not Currently  Other Topics Concern  . Not on file  Social History Narrative  . Not on file     Family History: The patient's ***family history includes CAD in his father; Heart  disease in his father; Kidney cancer in his father; Skin cancer in his father.  ROS:   ROS Please see the history of present illness.    *** All other systems reviewed and are negative.  EKGs/Labs/Other Studies Reviewed:    The following studies were reviewed today: *** EKG:  EKG is *** ordered today.  The ekg ordered today demonstrates *** EKG 03/03/17:Interface, External Ris In - 03/03/2017 10:52 AM EDT Ventricular Rate                   55        BPM                  Atrial Rate                        55        BPM                  P-R Interval                       122       ms                   QRS Duration                       90        ms                   Q-T Interval                       420       ms                   QTC                                401       ms                   P Axis                             29        degrees              R Axis  37        degrees              T Axis                             8         degrees              Sinus bradycardia Otherwise normal ECG No previous ECGs available Confirmed by [facility], DR. F.R. ([id number]) on 03/03/2017 10:52:34 AM   Recent Labs: 03/04/07: Glu 145, CMP OW normal No results found for requested labs within last 8760 hours.  Recent Lipid Panel 12/23/16: Chol249, HDL 52 LDL 161 TG 247 No results found for: CHOL, TRIG, HDL, CHOLHDL, VLDL, LDLCALC, LDLDIRECT  Physical Exam:    VS:  There were no vitals taken for this visit.    Wt Readings from Last 3 Encounters:  08/15/15 232 lb (105.2 kg)  08/13/15 232 lb 5.8 oz (105.4 kg)  05/23/15 230 lb (104.3 kg)     GEN: *** Well nourished, well developed in no acute distress HEENT: Normal NECK: No JVD; No carotid bruits LYMPHATICS: No lymphadenopathy CARDIAC: ***RRR, no murmurs, rubs, gallops RESPIRATORY:  Clear to auscultation without rales, wheezing or rhonchi  ABDOMEN: Soft, non-tender, non-distended MUSCULOSKELETAL:  No edema; No  deformity  SKIN: Warm and dry NEUROLOGIC:  Alert and oriented x 3 PSYCHIATRIC:  Normal affect     Signed, Norman Herrlich, MD  04/04/2017 2:58 PM    [facility]

## 2017-06-14 IMAGING — CR DG PORTABLE PELVIS
1 series · 1 of 1 positions shown · non-contrast
Comparison: Pelvis plain film dated 05/23/2015.

CLINICAL DATA: Left hip replacement postop

EXAM:
PORTABLE PELVIS 1-2 VIEWS

[AP]
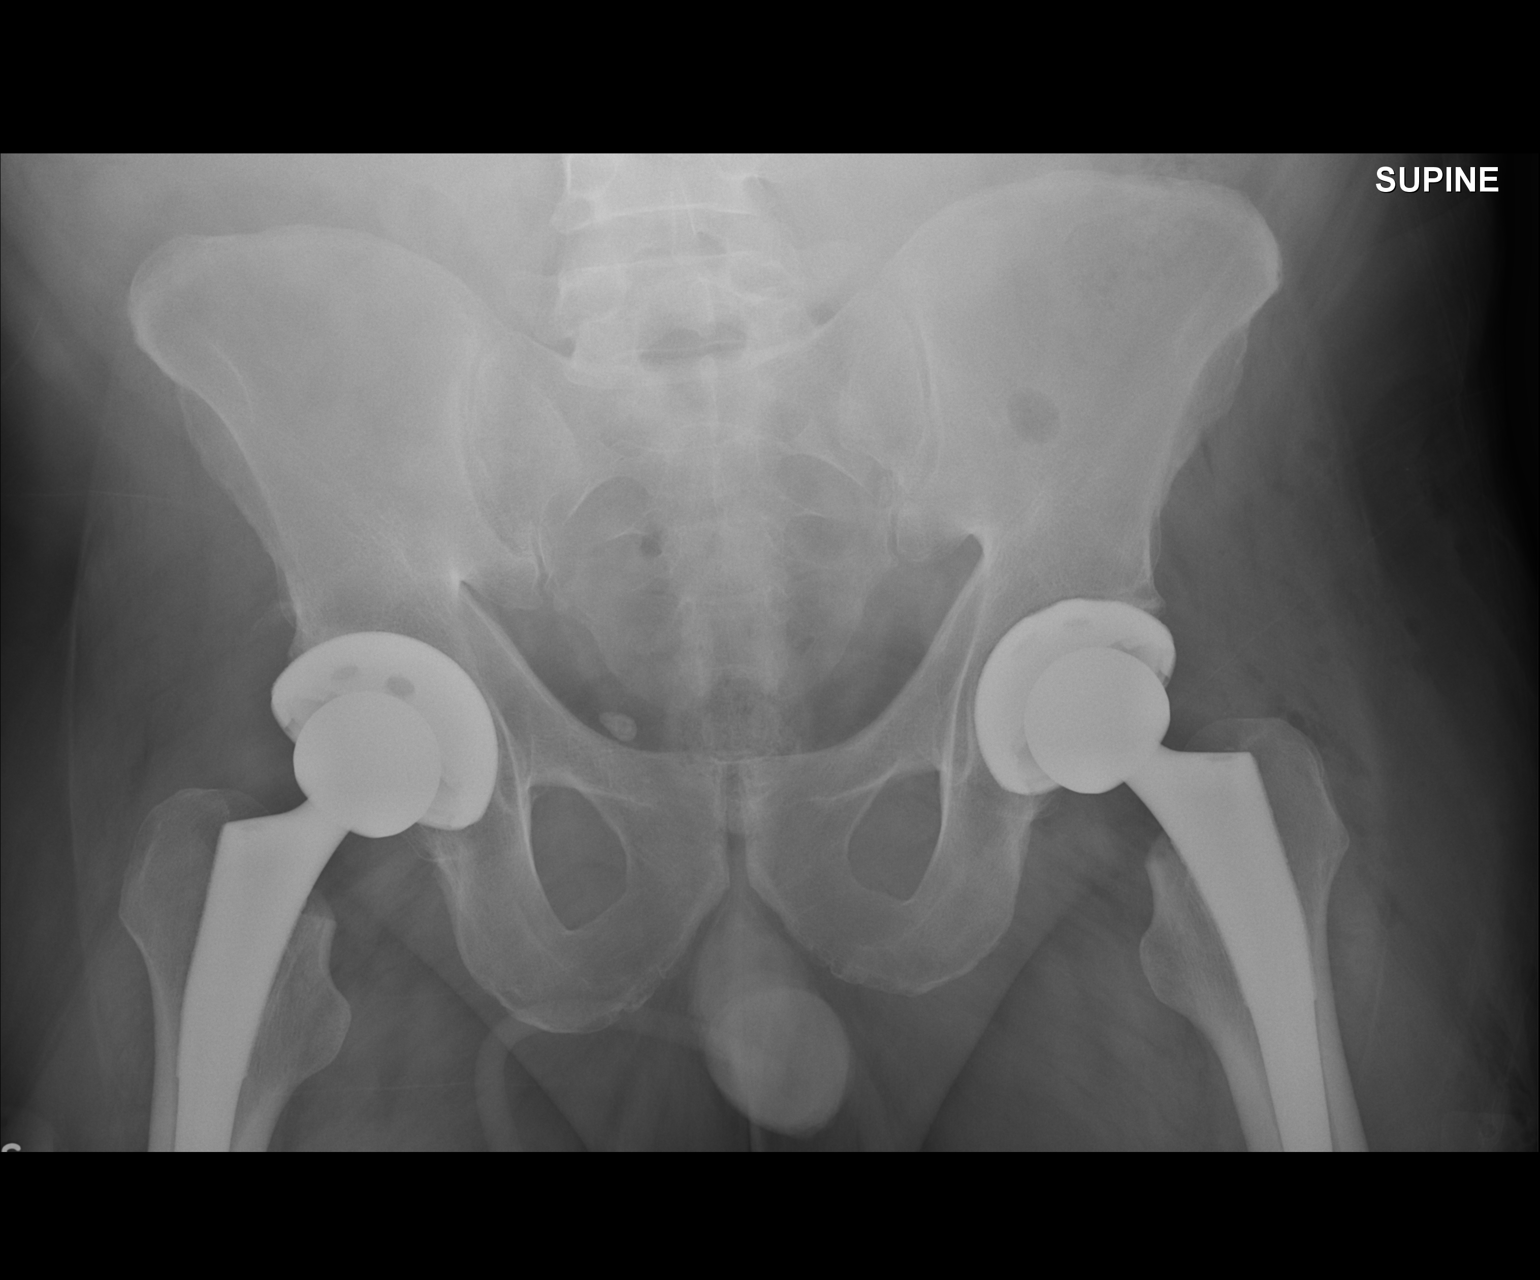

[1 of 1 positions shown; findings below may reference images not displayed]

FINDINGS: Patient is status post total left hip arthroplasty. Hardware appears
intact and appropriately positioned. The previous right hip
arthroplasty hardware appears stable in alignment. No acute or
suspicious osseous lesion. Soft tissues about the pelvis and hips
are unremarkable.
IMPRESSION: Bilateral total hip arthroplasty hardware which appear intact and
appropriately positioned. No evidence of surgical complicating
feature.
